# Patient Record
Sex: Female | Born: 1975 | Race: White | Hispanic: No | Marital: Married | Smoking: Never smoker
Health system: Southern US, Community
[De-identification: ages and names within clinical notes are randomized; demographics above are authoritative.]

## PROBLEM LIST (undated history)

## (undated) DIAGNOSIS — R32 Unspecified urinary incontinence: Secondary | ICD-10-CM

## (undated) DIAGNOSIS — N946 Dysmenorrhea, unspecified: Secondary | ICD-10-CM

## (undated) DIAGNOSIS — N979 Female infertility, unspecified: Secondary | ICD-10-CM

## (undated) DIAGNOSIS — T7840XA Allergy, unspecified, initial encounter: Secondary | ICD-10-CM

## (undated) DIAGNOSIS — M1990 Unspecified osteoarthritis, unspecified site: Secondary | ICD-10-CM

## (undated) DIAGNOSIS — N809 Endometriosis, unspecified: Secondary | ICD-10-CM

## (undated) HISTORY — PX: OTHER SURGICAL HISTORY: SHX169

## (undated) HISTORY — DX: Allergy, unspecified, initial encounter: T78.40XA

## (undated) HISTORY — DX: Female infertility, unspecified: N97.9

## (undated) HISTORY — DX: Unspecified urinary incontinence: R32

## (undated) HISTORY — DX: Dysmenorrhea, unspecified: N94.6

## (undated) HISTORY — DX: Endometriosis, unspecified: N80.9

## (undated) HISTORY — PX: HYSTEROSCOPY: SHX211

## (undated) HISTORY — DX: Unspecified osteoarthritis, unspecified site: M19.90

## (undated) HISTORY — PX: GANGLION CYST EXCISION: SHX1691

---

## 1999-10-04 HISTORY — PX: REFRACTIVE SURGERY: SHX103

## 2003-10-04 HISTORY — PX: GANGLION CYST EXCISION: SHX1691

## 2004-10-03 HISTORY — PX: HYSTEROSCOPY: SHX211

## 2018-07-30 ENCOUNTER — Ambulatory Visit: Payer: Self-pay | Admitting: Family Medicine

## 2018-08-20 ENCOUNTER — Ambulatory Visit: Payer: Self-pay | Admitting: Family Medicine

## 2018-09-21 ENCOUNTER — Ambulatory Visit (INDEPENDENT_AMBULATORY_CARE_PROVIDER_SITE_OTHER): Payer: BLUE CROSS/BLUE SHIELD | Admitting: Family Medicine

## 2018-09-21 ENCOUNTER — Encounter: Payer: Self-pay | Admitting: Family Medicine

## 2018-09-21 VITALS — BP 108/75 | HR 85 | Temp 98.3°F | Ht 65.0 in | Wt 144.7 lb

## 2018-09-21 DIAGNOSIS — M1710 Unilateral primary osteoarthritis, unspecified knee: Secondary | ICD-10-CM

## 2018-09-21 DIAGNOSIS — Z8742 Personal history of other diseases of the female genital tract: Secondary | ICD-10-CM | POA: Diagnosis not present

## 2018-09-21 DIAGNOSIS — M1990 Unspecified osteoarthritis, unspecified site: Secondary | ICD-10-CM

## 2018-09-21 DIAGNOSIS — Z83438 Family history of other disorder of lipoprotein metabolism and other lipidemia: Secondary | ICD-10-CM

## 2018-09-21 DIAGNOSIS — Q513 Bicornate uterus: Secondary | ICD-10-CM | POA: Diagnosis not present

## 2018-09-21 DIAGNOSIS — Z789 Other specified health status: Secondary | ICD-10-CM | POA: Diagnosis not present

## 2018-09-21 DIAGNOSIS — Z0000 Encounter for general adult medical examination without abnormal findings: Secondary | ICD-10-CM

## 2018-09-21 DIAGNOSIS — Z8249 Family history of ischemic heart disease and other diseases of the circulatory system: Secondary | ICD-10-CM

## 2018-09-21 NOTE — Patient Instructions (Signed)
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
## 2018-09-23 DIAGNOSIS — M1990 Unspecified osteoarthritis, unspecified site: Secondary | ICD-10-CM | POA: Insufficient documentation

## 2018-09-23 DIAGNOSIS — Z83438 Family history of other disorder of lipoprotein metabolism and other lipidemia: Secondary | ICD-10-CM | POA: Insufficient documentation

## 2018-09-23 DIAGNOSIS — Z8249 Family history of ischemic heart disease and other diseases of the circulatory system: Secondary | ICD-10-CM | POA: Insufficient documentation

## 2018-09-23 DIAGNOSIS — Z8742 Personal history of other diseases of the female genital tract: Secondary | ICD-10-CM | POA: Insufficient documentation

## 2018-09-23 DIAGNOSIS — M1710 Unilateral primary osteoarthritis, unspecified knee: Secondary | ICD-10-CM | POA: Insufficient documentation

## 2018-09-24 ENCOUNTER — Encounter: Payer: Self-pay | Admitting: Obstetrics and Gynecology

## 2018-10-09 ENCOUNTER — Encounter: Payer: Self-pay | Admitting: Obstetrics and Gynecology

## 2018-10-09 ENCOUNTER — Ambulatory Visit (INDEPENDENT_AMBULATORY_CARE_PROVIDER_SITE_OTHER): Payer: BLUE CROSS/BLUE SHIELD | Admitting: Obstetrics and Gynecology

## 2018-10-09 ENCOUNTER — Other Ambulatory Visit: Payer: Self-pay

## 2018-10-09 ENCOUNTER — Other Ambulatory Visit (HOSPITAL_COMMUNITY)
Admission: RE | Admit: 2018-10-09 | Discharge: 2018-10-09 | Disposition: A | Discharge status: Home / Self Care | Payer: BLUE CROSS/BLUE SHIELD | Source: Ambulatory Visit | Attending: Obstetrics and Gynecology | Admitting: Obstetrics and Gynecology

## 2018-10-09 VITALS — BP 112/70 | HR 80 | Ht 65.0 in | Wt 147.0 lb

## 2018-10-09 DIAGNOSIS — Z124 Encounter for screening for malignant neoplasm of cervix: Secondary | ICD-10-CM

## 2018-10-09 DIAGNOSIS — Z01419 Encounter for gynecological examination (general) (routine) without abnormal findings: Secondary | ICD-10-CM

## 2018-10-09 DIAGNOSIS — Z23 Encounter for immunization: Secondary | ICD-10-CM | POA: Diagnosis not present

## 2018-10-09 DIAGNOSIS — N393 Stress incontinence (female) (male): Secondary | ICD-10-CM | POA: Diagnosis not present

## 2018-10-09 MED ORDER — LEVONORGEST-ETH ESTRAD 91-DAY 0.1-0.02 & 0.01 MG PO TABS: 1.0000 | ORAL_TABLET | Freq: Every day | ORAL | 4 refills | Status: DC

## 2018-10-09 NOTE — Patient Instructions (Addendum)
EXERCISE AND DIET:  We recommended that you start or continue a regular exercise program for good health. Regular exercise means any activity that makes your heart beat faster and makes you sweat.  We recommend exercising at least 30 minutes per day at least 3 days a week, preferably 4 or 5.  We also recommend a diet low in fat and sugar.  Inactivity, poor dietary choices and obesity can cause diabetes, heart attack, stroke, and kidney damage, among others.    ALCOHOL AND SMOKING:  Women should limit their alcohol intake to no more than 7 drinks/beers/glasses of wine (combined, not each!) per week. Moderation of alcohol intake to this level decreases your risk of breast cancer and liver damage. And of course, no recreational drugs are part of a healthy lifestyle.  And absolutely no smoking or even second hand smoke. Most people know smoking can cause heart and lung diseases, but did you know it also contributes to weakening of your bones? Aging of your skin?  Yellowing of your teeth and nails?  CALCIUM AND VITAMIN D:  Adequate intake of calcium and Vitamin D are recommended.  The recommendations for exact amounts of these supplements seem to change often, but generally speaking 1,000 mg of calcium (between diet and supplement) and 800 units of Vitamin D per day seems prudent. Certain women may benefit from higher intake of Vitamin D.  If you are among these women, your doctor will have told you during your visit.    PAP SMEARS:  Pap smears, to check for cervical cancer or precancers,  have traditionally been done yearly, although recent scientific advances have shown that most women can have pap smears less often.  However, every woman still should have a physical exam from her gynecologist every year. It will include a breast check, inspection of the vulva and vagina to check for abnormal growths or skin changes, a visual exam of the cervix, and then an exam to evaluate the size and shape of the uterus and  ovaries.  And after 40 years of age, a rectal exam is indicated to check for rectal cancers. We will also provide age appropriate advice regarding health maintenance, like when you should have certain vaccines, screening for sexually transmitted diseases, bone density testing, colonoscopy, mammograms, etc.   MAMMOGRAMS:  All women over 40 years old should have a yearly mammogram. Many facilities now offer a "3D" mammogram, which may cost around $50 extra out of pocket. If possible,  we recommend you accept the option to have the 3D mammogram performed.  It both reduces the number of women who will be called back for extra views which then turn out to be normal, and it is better than the routine mammogram at detecting truly abnormal areas.    COLON CANCER SCREENING: Now recommend starting at age 45. At this time colonoscopy is not covered for routine screening until 50. There are take home tests that can be done between 45-49.   COLONOSCOPY:  Colonoscopy to screen for colon cancer is recommended for all women at age 50.  We know, you hate the idea of the prep.  We agree, BUT, having colon cancer and not knowing it is worse!!  Colon cancer so often starts as a polyp that can be seen and removed at colonscopy, which can quite literally save your life!  And if your first colonoscopy is normal and you have no family history of colon cancer, most women don't have to have it again for   10 years.  Once every ten years, you can do something that may end up saving your life, right?  We will be happy to help you get it scheduled when you are ready.  Be sure to check your insurance coverage so you understand how much it will cost.  It may be covered as a preventative service at no cost, but you should check your particular policy.      Breast Self-Awareness Breast self-awareness means being familiar with how your breasts look and feel. It involves checking your breasts regularly and reporting any changes to your  health care provider. Practicing breast self-awareness is important. A change in your breasts can be a sign of a serious medical problem. Being familiar with how your breasts look and feel allows you to find any problems early, when treatment is more likely to be successful. All women should practice breast self-awareness, including women who have had breast implants. How to do a breast self-exam One way to learn what is normal for your breasts and whether your breasts are changing is to do a breast self-exam. To do a breast self-exam: Look for Changes  1. Remove all the clothing above your waist. 2. Stand in front of a mirror in a room with good lighting. 3. Put your hands on your hips. 4. Push your hands firmly downward. 5. Compare your breasts in the mirror. Look for differences between them (asymmetry), such as: ? Differences in shape. ? Differences in size. ? Puckers, dips, and bumps in one breast and not the other. 6. Look at each breast for changes in your skin, such as: ? Redness. ? Scaly areas. 7. Look for changes in your nipples, such as: ? Discharge. ? Bleeding. ? Dimpling. ? Redness. ? A change in position. Feel for Changes Carefully feel your breasts for lumps and changes. It is best to do this while lying on your back on the floor and again while sitting or standing in the shower or tub with soapy water on your skin. Feel each breast in the following way:  Place the arm on the side of the breast you are examining above your head.  Feel your breast with the other hand.  Start in the nipple area and make  inch (2 cm) overlapping circles to feel your breast. Use the pads of your three middle fingers to do this. Apply light pressure, then medium pressure, then firm pressure. The light pressure will allow you to feel the tissue closest to the skin. The medium pressure will allow you to feel the tissue that is a little deeper. The firm pressure will allow you to feel the tissue  close to the ribs.  Continue the overlapping circles, moving downward over the breast until you feel your ribs below your breast.  Move one finger-width toward the center of the body. Continue to use the  inch (2 cm) overlapping circles to feel your breast as you move slowly up toward your collarbone.  Continue the up and down exam using all three pressures until you reach your armpit.  Write Down What You Find  Write down what is normal for each breast and any changes that you find. Keep a written record with breast changes or normal findings for each breast. By writing this information down, you do not need to depend only on memory for size, tenderness, or location. Write down where you are in your menstrual cycle, if you are still menstruating. If you are having trouble noticing differences   in your breasts, do not get discouraged. With time you will become more familiar with the variations in your breasts and more comfortable with the exam. How often should I examine my breasts? Examine your breasts every month. If you are breastfeeding, the best time to examine your breasts is after a feeding or after using a breast pump. If you menstruate, the best time to examine your breasts is 5-7 days after your period is over. During your period, your breasts are lumpier, and it may be more difficult to notice changes. When should I see my health care provider? See your health care provider if you notice:  A change in shape or size of your breasts or nipples.  A change in the skin of your breast or nipples, such as a reddened or scaly area.  Unusual discharge from your nipples.  A lump or thick area that was not there before.  Pain in your breasts.  Anything that concerns you.  Mammogram Facilities  Yearly screening mammograms are recommended for women beginning at age 63. For a routine screening mammogram, you may schedule the appointment and have it done at the location of your choice.   Please ask the facility to send the results to our office. (fax [phone]) Location options include:  *[facility] [address] [place], [address] [place], [place] [address] [phone]  [facility] [address], [address] [place], [place] [address] [phone]   Urinary Incontinence  Urinary incontinence refers to a condition in which a person is unable to control where and when to pass urine. A person with this condition will urinate when he or she does not mean to (involuntarily). What are the causes? This condition may be caused by:  Medicines.  Infections.  Constipation.  Overactive bladder muscles.  Weak bladder muscles.  Weak pelvic floor muscles. These muscles provide support for the bladder, intestine, and, in women, the uterus.  Enlarged prostate in men. The prostate is a gland near the bladder. When it gets too big, it can pinch the urethra. With the urethra blocked, the bladder can weaken and lose the ability to empty properly.  Surgery.  Emotional factors, such as anxiety, stress, or post-traumatic stress disorder (PTSD).  Pelvic organ prolapse. This happens in women when organs shift out of place and into the vagina. This shift can prevent the bladder and urethra from working properly. What increases the risk? The following factors may make you more likely to develop this condition:  Older age.  Obesity and physical inactivity.  Pregnancy and childbirth.  Menopause.  Diseases that affect the nerves or spinal cord (neurological diseases).  Long-term (chronic) coughing. This can increase pressure on the bladder and pelvic floor muscles. What are the signs or symptoms? Symptoms may vary depending on the type of urinary incontinence you have. They include:  A sudden urge to urinate, but passing urine involuntarily before you can get to a bathroom (urge incontinence).  Suddenly passing urine with any activity  that forces urine to pass, such as coughing, laughing, exercise, or sneezing (stress incontinence).  Needing to urinate often, but urinating only a small amount, or constantly dribbling urine (overflow incontinence).  Urinating because you cannot get to the bathroom in time due to a physical disability, such as arthritis or injury, or communication and thinking problems, such as Alzheimer disease (functional incontinence). How is this diagnosed? This condition may be diagnosed based on:  Your medical history.  A physical exam.  Tests, such as: ? Urine tests. ? X-rays of your kidney and bladder. ? Ultrasound. ? CT scan. ? Cystoscopy. In this procedure, a health care provider inserts a tube with a light and camera (cystoscope) through the urethra and into the bladder in order to check for problems. ? Urodynamic testing. These tests assess how well the bladder, urethra, and sphincter can store and release urine. There are different types of urodynamic tests, and they vary depending on what the test is measuring. To help diagnose your condition, your health care provider may recommend that you keep a log of when you urinate and how much you urinate. How is this treated? Treatment for this condition depends on the type of incontinence that you have and its cause. Treatment may include:  Lifestyle changes, such as: ? Quitting smoking. ? Maintaining a healthy weight. ? Staying active. Try to get 150 minutes of moderate-intensity exercise every week. Ask your health care provider which activities are safe for you. ? Eating a healthy diet.  Avoid high-fat foods, like fried foods.  Avoid refined carbohydrates like white bread and white rice.  Limit how much alcohol and caffeine you drink.  Increase your fiber intake. Foods such as fresh fruits, vegetables, beans, and whole grains are healthy sources of fiber.  Pelvic floor muscle exercises.  Bladder training, such as lengthening the  amount of time between bathroom breaks, or using the bathroom at regular intervals.  Using techniques to suppress bladder urges. This can include distraction techniques or controlled breathing exercises.  Medicines to relax the bladder muscles and prevent bladder spasms.  Medicines to help slow or prevent the growth of a man's prostate.  Botox injections. These can help relax the bladder muscles.  Using pulses of electricity to help change bladder reflexes (electrical nerve stimulation).  For women, using a medical device to prevent urine leaks. This is a small, tampon-like, disposable device that is inserted into the urethra.  Injecting collagen or carbon beads (bulking agents) into the urinary sphincter. These can help thicken tissue and close the bladder opening.  Surgery. Follow these instructions at home: Lifestyle  Limit alcohol and caffeine. These can fill your bladder quickly and irritate it.  Keep yourself clean to help prevent odors and skin damage. Ask your doctor about special skin creams and cleansers that can protect the skin from urine.  Consider wearing pads or adult diapers. Make sure to change them regularly, and always change them right after experiencing incontinence. General instructions  Take over-the-counter and prescription medicines only as told by your health care provider.  Use the bathroom about every 3-4 hours, even if you do not feel the need to urinate. Try to empty your bladder completely every time. After urinating, wait a minute. Then try to urinate again.  Make sure you are in a relaxed position while urinating.  If your incontinence is caused by nerve problems, keep a log of the medicines you take and the times you go to the bathroom.  Keep all follow-up visits as told by your health care provider. This is important. Contact a health care provider if:  You have pain that gets worse.  Your incontinence gets worse. Get help right away  if:  You have a fever or chills.  You are unable to urinate.  You have redness in your groin area or down your legs. Summary  Urinary incontinence refers to a condition in which a person is unable to control where and when to pass urine.  This condition may be caused by medicines, infection, weak bladder muscles, weak pelvic floor muscles, enlargement of the prostate (in men), or surgery.  The following factors increase your risk for developing this condition: older age, obesity, pregnancy and childbirth, menopause, neurological diseases, and chronic coughing.  There are several types of urinary incontinence. They include urge incontinence, stress incontinence, overflow incontinence, and functional incontinence.  This condition is usually treated first with lifestyle and behavioral changes, such as quitting smoking, eating a healthier diet, and doing regular pelvic floor exercises. Other treatment options include medicines, bulking agents, medical devices, electrical nerve stimulation, or surgery. This information is not intended to replace advice given to you by your health care provider. Make sure you discuss any questions you have with your health care provider. Document Released: 10/27/2004 Document Revised: 12/29/2016 Document Reviewed: 12/29/2016 Elsevier Interactive Patient Education  2019 [employer]  Kegel Exercises Kegel exercises help strengthen the muscles that support the rectum, vagina, small intestine, bladder, and uterus. Doing Kegel exercises can help:  Improve bladder and bowel control.  Improve sexual response.  Reduce problems and discomfort during pregnancy. Kegel exercises involve squeezing your pelvic floor muscles, which are the same muscles you squeeze when you try to stop the flow of urine. The exercises can be done while sitting, standing, or lying down, but it is best to vary your position. Exercises 1. Squeeze your pelvic floor muscles tight. You should  feel a tight lift in your rectal area. If you are a female, you should also feel a tightness in your vaginal area. Keep your stomach, buttocks, and legs relaxed. 2. Hold the muscles tight for up to 10 seconds. 3. Relax your muscles. Repeat this exercise 50 times a day or as many times as told by your health care provider. Continue to do this exercise for at least 4-6 weeks or for as long as told by your health care provider. This information is not intended to replace advice given to you by your health care provider. Make sure you discuss any questions you have with your health care provider. Document Released: 09/05/2012 Document Revised: 01/30/2017 Document Reviewed: 08/09/2015 Elsevier Interactive Patient Education  2019 [employer]

## 2018-10-09 NOTE — Progress Notes (Signed)
43 y.o. G42P2002 Married White or Caucasian Not Hispanic or Latino female here for annual exam and wants to talk about birth control alternatives. She is interested in a different pill.  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 She went on to have 2 FTNSVD's.    Period Cycle (Days): 84 Period Duration (Days): 4 days Period Pattern: Regular Menstrual Flow: Light Menstrual Control: Other (Comment) Menstrual Control Change Freq (Hours): empties cup twice a day Dysmenorrhea: None  Patient's last menstrual period was 07/20/2018 (exact date).          Sexually active: Yes.    The current method of family planning is OCP (estrogen/progesterone).    Exercising: Yes.    walking Smoker:  no  Health Maintenance: Pap:  Fall 2018 WNL per patient History of abnormal Pap:  Yes, many years ago, repeat was normal MMG:  November of 2018,WNL per patient, [place] out of state BMD:   Never Colonoscopy: Never TDaP:  Unsure Gardasil: Never   reports that she has never smoked. She has never used smokeless tobacco. She reports current alcohol use of about 1.0 - 2.0 standard drinks of alcohol per week. She reports that she does not use drugs. 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 Kids 49.68 year old son and almost 58 year old daughter.   Past Medical History:  Diagnosis Date  . Dysmenorrhea   . Endometriosis   . Infertility, female   . Urinary incontinence   H/O severe endometriosis. Treated at time of laparoscopy.  Past Surgical History:  Procedure Laterality Date  . GANGLION CYST EXCISION Bilateral   . HYSTEROSCOPY    . OTHER SURGICAL HISTORY     2 uterus surgries in 2006  . REFRACTIVE SURGERY  2001    Current Outpatient Medications  Medication Sig Dispense Refill  . CANNABIDIOL PO Take by mouth.    . Fexofenadine HCl (ALLEGRA PO) Take 1 tablet by mouth daily.    . Multiple Vitamin (MULTIVITAMIN) tablet Take 1 tablet by mouth daily.     . naproxen sodium (ALEVE) 220 MG tablet Take 220 mg by mouth.    . norgestrel-ethinyl estradiol (OGESTROL) 0.5-50 MG-MCG tablet Take 1 tablet by mouth daily.    . Omega-3 Fatty Acids (FISH OIL PO) Take 1 capsule by mouth daily.     No current facility-administered medications for this visit.     Family History  Problem Relation Age of Onset  . Diabetes Father 45  . High Cholesterol Father   . Hypertension Father   . Alcohol abuse Maternal Grandfather   . Diabetes Brother 20  . Hyperlipidemia Brother   . Obesity Brother   . Diabetes Brother 20  . Hyperlipidemia Brother   . Obesity Brother     Review of Systems  Constitutional: Negative.   HENT: Negative.   Eyes: Negative.   Respiratory: Negative.   Cardiovascular: Negative.   Gastrointestinal: Negative.   Endocrine: Negative.   Genitourinary:       Urinary leakage  Musculoskeletal: Negative.   Skin: Negative.   Allergic/Immunologic: Negative.   Neurological: Negative.   Hematological: Negative.   Psychiatric/Behavioral: Negative.   She has urinary leakage with running or being on the trampoline, some with laughing or coughing. Leaks a small amount a couple of times a week.  Exam:   BP 112/70 (BP Location: Right Arm, Patient Position: Sitting, Cuff Size: Normal)   Pulse 80   Ht 5\' 5"  (  1.651 m)   Wt 147 lb (66.7 kg)   LMP 07/20/2018 (Exact Date)   BMI 24.46 kg/m   Weight change: @WEIGHTCHANGE @ Height:   Height: 5\' 5"  (165.1 cm)  Ht Readings from Last 3 Encounters:  10/09/18 5\' 5"  (1.651 m)  09/21/18 5\' 5"  (1.651 m)    General appearance: alert, cooperative and appears stated age Head: Normocephalic, without obvious abnormality, atraumatic Neck: no adenopathy, supple, symmetrical, trachea midline and thyroid normal to inspection and palpation Lungs: clear to auscultation bilaterally Cardiovascular: regular rate and rhythm Breasts: normal appearance, no masses or tenderness Abdomen: soft, non-tender; non  distended,  no masses,  no organomegaly Extremities: extremities normal, atraumatic, no cyanosis or edema Skin: Skin color, texture, turgor normal. No rashes or lesions Lymph nodes: Cervical, supraclavicular, and axillary nodes normal. No abnormal inguinal nodes palpated Neurologic: Grossly normal   Pelvic: External genitalia:  no lesions              Urethra:  normal appearing urethra with no masses, tenderness or lesions              Bartholins and Skenes: normal                 Vagina: normal appearing vagina with normal color and discharge, no lesions              Cervix: no lesions               Bimanual Exam:  Uterus:  normal size, contour, position, consistency, mobility, non-tender              Adnexa: no mass, fullness, tenderness               Rectovaginal: Confirms               Anus:  normal sphincter tone, no lesions  Chaperone was present for exam.  A:  Well Woman with normal exam  Currently on high dose OCP (she didn't realize it was high dose), will change to 20 mcg pill   GSI  P:   Pap with hpv  Change to loseasonique  Information on kegels given, discussed option of PT and surgery for GSI  Mammogram due  TDAP today  Labs with primary   Discussed breast self exam  Discussed calcium and vit D intake

## 2018-10-09 NOTE — Addendum Note (Signed)
Addended by: Tobi BastosJERTSON, Yvonne Stopher E on: 10/09/2018 10:08 AM   Modules accepted: Orders

## 2018-10-10 LAB — CYTOLOGY - PAP
Diagnosis: NEGATIVE
HPV: NOT DETECTED

## 2018-10-18 ENCOUNTER — Other Ambulatory Visit: Payer: Self-pay | Admitting: Family Medicine

## 2018-10-18 DIAGNOSIS — Z1231 Encounter for screening mammogram for malignant neoplasm of breast: Secondary | ICD-10-CM

## 2018-11-07 ENCOUNTER — Ambulatory Visit (INDEPENDENT_AMBULATORY_CARE_PROVIDER_SITE_OTHER): Payer: BLUE CROSS/BLUE SHIELD | Admitting: Family Medicine

## 2018-11-07 ENCOUNTER — Encounter: Payer: Self-pay | Admitting: Family Medicine

## 2018-11-07 VITALS — BP 115/77 | HR 78 | Temp 98.3°F | Ht 65.0 in | Wt 149.9 lb

## 2018-11-07 DIAGNOSIS — Z0000 Encounter for general adult medical examination without abnormal findings: Secondary | ICD-10-CM

## 2018-11-07 DIAGNOSIS — Z719 Counseling, unspecified: Secondary | ICD-10-CM

## 2018-11-07 NOTE — Patient Instructions (Addendum)
Preventive Care for Adults, Female  A healthy lifestyle and preventive care can promote health and wellness. Preventive health guidelines for women include the following key practices.   A routine yearly physical is a good way to check with your health care provider about your health and preventive screening. It is a chance to share any concerns and updates on your health and to receive a thorough exam.   Visit your dentist for a routine exam and preventive care every 6 months. Brush your teeth twice a day and floss once a day. Good oral hygiene prevents tooth decay and gum disease.   The frequency of eye exams is based on your age, health, family medical history, use of contact lenses, and other factors. Follow your health care provider's recommendations for frequency of eye exams.   Eat a healthy diet. Foods like vegetables, fruits, whole grains, low-fat dairy products, and lean protein foods contain the nutrients you need without too many calories. Decrease your intake of foods high in solid fats, added sugars, and salt. Eat the right amount of calories for you.Get information about a proper diet from your health care provider, if necessary.   Regular physical exercise is one of the most important things you can do for your health. Most adults should get at least 150 minutes of moderate-intensity exercise (any activity that increases your heart rate and causes you to sweat) each week. In addition, most adults need muscle-strengthening exercises on 2 or more days a week.   Maintain a healthy weight. The body mass index (BMI) is a screening tool to identify possible weight problems. It provides an estimate of body fat based on height and weight. Your health care provider can find your BMI, and can help you achieve or maintain a healthy weight.For adults 20 years and older:   - A BMI below 18.5 is considered underweight.   - A BMI of 18.5 to 24.9 is normal.   - A BMI of 25 to 29.9 is  considered overweight.   - A BMI of 30 and above is considered obese.   Maintain normal blood lipids and cholesterol levels by exercising and minimizing your intake of trans and saturated fats.  Eat a balanced diet with plenty of fruit and vegetables. Blood tests for lipids and cholesterol should begin at age 20 and be repeated every 5 years minimum.  If your lipid or cholesterol levels are high, you are over 40, or you are at high risk for heart disease, you may need your cholesterol levels checked more frequently.Ongoing high lipid and cholesterol levels should be treated with medicines if diet and exercise are not working.   If you smoke, find out from your health care provider how to quit. If you do not use tobacco, do not start.   Lung cancer screening is recommended for adults aged 55-80 years who are at high risk for developing lung cancer because of a history of smoking. A yearly low-dose CT scan of the lungs is recommended for people who have at least a 30-pack-year history of smoking and are a current smoker or have quit within the past 15 years. A pack year of smoking is smoking an average of 1 pack of cigarettes a day for 1 year (for example: 1 pack a day for 30 years or 2 packs a day for 15 years). Yearly screening should continue until the smoker has stopped smoking for at least 15 years. Yearly screening should be stopped for people who develop a   health problem that would prevent them from having lung cancer treatment.   If you are pregnant, do not drink alcohol. If you are breastfeeding, be very cautious about drinking alcohol. If you are not pregnant and choose to drink alcohol, do not have more than 1 drink per day. One drink is considered to be 12 ounces (355 mL) of beer, 5 ounces (148 mL) of wine, or 1.5 ounces (44 mL) of liquor.   Avoid use of street drugs. Do not share needles with anyone. Ask for help if you need support or instructions about stopping the use of  drugs.   High blood pressure causes heart disease and increases the risk of stroke. Your blood pressure should be checked at least yearly.  Ongoing high blood pressure should be treated with medicines if weight loss and exercise do not work.   If you are 69-55 years old, ask your health care provider if you should take aspirin to prevent strokes.   Diabetes screening involves taking a blood sample to check your fasting blood sugar level. This should be done once every 3 years, after age 38, if you are within normal weight and without risk factors for diabetes. Testing should be considered at a younger age or be carried out more frequently if you are overweight and have at least 1 risk factor for diabetes.   Breast cancer screening is essential preventive care for women. You should practice "breast self-awareness."  This means understanding the normal appearance and feel of your breasts and may include breast self-examination.  Any changes detected, no matter how small, should be reported to a health care provider.  Women in their 80s and 30s should have a clinical breast exam (CBE) by a health care provider as part of a regular health exam every 1 to 3 years.  After age 66, women should have a CBE every year.  Starting at age 1, women should consider having a mammogram (breast X-ray test) every year.  Women who have a family history of breast cancer should talk to their health care provider about genetic screening.  Women at a high risk of breast cancer should talk to their health care providers about having an MRI and a mammogram every year.   -Breast cancer gene (BRCA)-related cancer risk assessment is recommended for women who have family members with BRCA-related cancers. BRCA-related cancers include breast, ovarian, tubal, and peritoneal cancers. Having family members with these cancers may be associated with an increased risk for harmful changes (mutations) in the breast cancer genes BRCA1 and  BRCA2. Results of the assessment will determine the need for genetic counseling and BRCA1 and BRCA2 testing.   The Pap test is a screening test for cervical cancer. A Pap test can show cell changes on the cervix that might become cervical cancer if left untreated. A Pap test is a procedure in which cells are obtained and examined from the lower end of the uterus (cervix).   - Women should have a Pap test starting at age 57.   - Between ages 90 and 70, Pap tests should be repeated every 2 years.   - Beginning at age 63, you should have a Pap test every 3 years as long as the past 3 Pap tests have been normal.   - Some women have medical problems that increase the chance of getting cervical cancer. Talk to your health care provider about these problems. It is especially important to talk to your health care provider if a  new problem develops soon after your last Pap test. In these cases, your health care provider may recommend more frequent screening and Pap tests.   - The above recommendations are the same for women who have or have not gotten the vaccine for human papillomavirus (HPV).   - If you had a hysterectomy for a problem that was not cancer or a condition that could lead to cancer, then you no longer need Pap tests. Even if you no longer need a Pap test, a regular exam is a good idea to make sure no other problems are starting.   - If you are between ages 36 and 66 years, and you have had normal Pap tests going back 10 years, you no longer need Pap tests. Even if you no longer need a Pap test, a regular exam is a good idea to make sure no other problems are starting.   - If you have had past treatment for cervical cancer or a condition that could lead to cancer, you need Pap tests and screening for cancer for at least 20 years after your treatment.   - If Pap tests have been discontinued, risk factors (such as a new sexual partner) need to be reassessed to determine if screening should  be resumed.   - The HPV test is an additional test that may be used for cervical cancer screening. The HPV test looks for the virus that can cause the cell changes on the cervix. The cells collected during the Pap test can be tested for HPV. The HPV test could be used to screen women aged 70 years and older, and should be used in women of any age who have unclear Pap test results. After the age of 67, women should have HPV testing at the same frequency as a Pap test.   Colorectal cancer can be detected and often prevented. Most routine colorectal cancer screening begins at the age of 57 years and continues through age 26 years. However, your health care provider may recommend screening at an earlier age if you have risk factors for colon cancer. On a yearly basis, your health care provider may provide home test kits to check for hidden blood in the stool.  Use of a small camera at the end of a tube, to directly examine the colon (sigmoidoscopy or colonoscopy), can detect the earliest forms of colorectal cancer. Talk to your health care provider about this at age 23, when routine screening begins. Direct exam of the colon should be repeated every 5 -10 years through age 49 years, unless early forms of pre-cancerous polyps or small growths are found.   People who are at an increased risk for hepatitis B should be screened for this virus. You are considered at high risk for hepatitis B if:  -You were born in a country where hepatitis B occurs often. Talk with your health care provider about which countries are considered high risk.  - Your parents were born in a high-risk country and you have not received a shot to protect against hepatitis B (hepatitis B vaccine).  - You have HIV or AIDS.  - You use needles to inject street drugs.  - You live with, or have sex with, someone who has Hepatitis B.  - You get hemodialysis treatment.  - You take certain medicines for conditions like cancer, organ  transplantation, and autoimmune conditions.   Hepatitis C blood testing is recommended for all people born from [birth year] through 1965 and any individual  with known risks for hepatitis C.   Practice safe sex. Use condoms and avoid high-risk sexual practices to reduce the spread of sexually transmitted infections (STIs). STIs include gonorrhea, chlamydia, syphilis, trichomonas, herpes, HPV, and human immunodeficiency virus (HIV). Herpes, HIV, and HPV are viral illnesses that have no cure. They can result in disability, cancer, and death. Sexually active women aged 25 years and younger should be checked for chlamydia. Older women with new or multiple partners should also be tested for chlamydia. Testing for other STIs is recommended if you are sexually active and at increased risk.   Osteoporosis is a disease in which the bones lose minerals and strength with aging. This can result in serious bone fractures or breaks. The risk of osteoporosis can be identified using a bone density scan. Women ages 65 years and over and women at risk for fractures or osteoporosis should discuss screening with their health care providers. Ask your health care provider whether you should take a calcium supplement or vitamin D to There are also several preventive steps women can take to avoid osteoporosis and resulting fractures or to keep osteoporosis from worsening. -->Recommendations include:  Eat a balanced diet high in fruits, vegetables, calcium, and vitamins.  Get enough calcium. The recommended total intake of is 1,200 mg daily; for best absorption, if taking supplements, divide doses into 250-500 mg doses throughout the day. Of the two types of calcium, calcium carbonate is best absorbed when taken with food but calcium citrate can be taken on an empty stomach.  Get enough vitamin D. NAMS and the National Osteoporosis Foundation recommend at least 1,000 IU per day for women age 50 and over who are at risk of vitamin D  deficiency. Vitamin D deficiency can be caused by inadequate sun exposure (for example, those who live in northern latitudes).  Avoid alcohol and smoking. Heavy alcohol intake (more than 7 drinks per week) increases the risk of falls and hip fracture and women smokers tend to lose bone more rapidly and have lower bone mass than nonsmokers. Stopping smoking is one of the most important changes women can make to improve their health and decrease risk for disease.  Be physically active every day. Weight-bearing exercise (for example, fast walking, hiking, jogging, and weight training) may strengthen bones or slow the rate of bone loss that comes with aging. Balancing and muscle-strengthening exercises can reduce the risk of falling and fracture.  Consider therapeutic medications. Currently, several types of effective drugs are available. Healthcare providers can recommend the type most appropriate for each woman.  Eliminate environmental factors that may contribute to accidents. Falls cause nearly 90% of all osteoporotic fractures, so reducing this risk is an important bone-health strategy. Measures include ample lighting, removing obstructions to walking, using nonskid rugs on floors, and placing mats and/or grab bars in showers.  Be aware of medication side effects. Some common medicines make bones weaker. These include a type of steroid drug called glucocorticoids used for arthritis and asthma, some antiseizure drugs, certain sleeping pills, treatments for endometriosis, and some cancer drugs. An overactive thyroid gland or using too much thyroid hormone for an underactive thyroid can also be a problem. If you are taking these medicines, talk to your doctor about what you can do to help protect your bones.reduce the rate of osteoporosis.    Menopause can be associated with physical symptoms and risks. Hormone replacement therapy is available to decrease symptoms and risks. You should talk to your  health care provider   about whether hormone replacement therapy is right for you.   Use sunscreen. Apply sunscreen liberally and repeatedly throughout the day. You should seek shade when your shadow is shorter than you. Protect yourself by wearing long sleeves, pants, a wide-brimmed hat, and sunglasses year round, whenever you are outdoors.   Once a month, do a whole body skin exam, using a mirror to look at the skin on your back. Tell your health care provider of new moles, moles that have irregular borders, moles that are larger than a pencil eraser, or moles that have changed in shape or color.   -Stay current with required vaccines (immunizations).   Influenza vaccine. All adults should be immunized every year.  Tetanus, diphtheria, and acellular pertussis (Td, Tdap) vaccine. Pregnant women should receive 1 dose of Tdap vaccine during each pregnancy. The dose should be obtained regardless of the length of time since the last dose. Immunization is preferred during the 27th 36th week of gestation. An adult who has not previously received Tdap or who does not know her vaccine status should receive 1 dose of Tdap. This initial dose should be followed by tetanus and diphtheria toxoids (Td) booster doses every 10 years. Adults with an unknown or incomplete history of completing a 3-dose immunization series with Td-containing vaccines should begin or complete a primary immunization series including a Tdap dose. Adults should receive a Td booster every 10 years.  Varicella vaccine. An adult without evidence of immunity to varicella should receive 2 doses or a second dose if she has previously received 1 dose. Pregnant females who do not have evidence of immunity should receive the first dose after pregnancy. This first dose should be obtained before leaving the health care facility. The second dose should be obtained 4 8 weeks after the first dose.  Human papillomavirus (HPV) vaccine. Females aged 13 26  years who have not received the vaccine previously should obtain the 3-dose series. The vaccine is not recommended for use in pregnant females. However, pregnancy testing is not needed before receiving a dose. If a female is found to be pregnant after receiving a dose, no treatment is needed. In that case, the remaining doses should be delayed until after the pregnancy. Immunization is recommended for any person with an immunocompromised condition through the age of 26 years if she did not get any or all doses earlier. During the 3-dose series, the second dose should be obtained 4 8 weeks after the first dose. The third dose should be obtained 24 weeks after the first dose and 16 weeks after the second dose.  Zoster vaccine. One dose is recommended for adults aged 60 years or older unless certain conditions are present.  Measles, mumps, and rubella (MMR) vaccine. Adults born before 1957 generally are considered immune to measles and mumps. Adults born in 1957 or later should have 1 or more doses of MMR vaccine unless there is a contraindication to the vaccine or there is laboratory evidence of immunity to each of the three diseases. A routine second dose of MMR vaccine should be obtained at least 28 days after the first dose for students attending postsecondary schools, health care workers, or international travelers. People who received inactivated measles vaccine or an unknown type of measles vaccine during 1963 1967 should receive 2 doses of MMR vaccine. People who received inactivated mumps vaccine or an unknown type of mumps vaccine before 1979 and are at high risk for mumps infection should consider immunization with 2 doses of   MMR vaccine. For females of childbearing age, rubella immunity should be determined. If there is no evidence of immunity, females who are not pregnant should be vaccinated. If there is no evidence of immunity, females who are pregnant should delay immunization until after pregnancy.  Unvaccinated health care workers born before [birth year] who lack laboratory evidence of measles, mumps, or rubella immunity or laboratory confirmation of disease should consider measles and mumps immunization with 2 doses of MMR vaccine or rubella immunization with 1 dose of MMR vaccine.  Pneumococcal 13-valent conjugate (PCV13) vaccine. When indicated, a person who is uncertain of her immunization history and has no record of immunization should receive the PCV13 vaccine. An adult aged 54 years or older who has certain medical conditions and has not been previously immunized should receive 1 dose of PCV13 vaccine. This PCV13 should be followed with a dose of pneumococcal polysaccharide (PPSV23) vaccine. The PPSV23 vaccine dose should be obtained at least 8 weeks after the dose of PCV13 vaccine. An adult aged 58 years or older who has certain medical conditions and previously received 1 or more doses of PPSV23 vaccine should receive 1 dose of PCV13. The PCV13 vaccine dose should be obtained 1 or more years after the last PPSV23 vaccine dose.  Pneumococcal polysaccharide (PPSV23) vaccine. When PCV13 is also indicated, PCV13 should be obtained first. All adults aged 58 years and older should be immunized. An adult younger than age 65 years who has certain medical conditions should be immunized. Any person who resides in a nursing home or long-term care facility should be immunized. An adult smoker should be immunized. People with an immunocompromised condition and certain other conditions should receive both PCV13 and PPSV23 vaccines. People with human immunodeficiency virus (HIV) infection should be immunized as soon as possible after diagnosis. Immunization during chemotherapy or radiation therapy should be avoided. Routine use of PPSV23 vaccine is not recommended for American Indians, [place] Natives, or people younger than 65 years unless there are medical conditions that require PPSV23 vaccine. When indicated,  people who have unknown immunization and have no record of immunization should receive PPSV23 vaccine. One-time revaccination 5 years after the first dose of PPSV23 is recommended for people aged 70 64 years who have chronic kidney failure, nephrotic syndrome, asplenia, or immunocompromised conditions. People who received 1 2 doses of PPSV23 before age 32 years should receive another dose of PPSV23 vaccine at age 96 years or later if at least 5 years have passed since the previous dose. Doses of PPSV23 are not needed for people immunized with PPSV23 at or after age 55 years.  Meningococcal vaccine. Adults with asplenia or persistent complement component deficiencies should receive 2 doses of quadrivalent meningococcal conjugate (MenACWY-D) vaccine. The doses should be obtained at least 2 months apart. Microbiologists working with certain meningococcal bacteria, [employer] recruits, people at risk during an outbreak, and people who travel to or live in countries with a high rate of meningitis should be immunized. A first-year college student up through age 58 years who is living in a residence hall should receive a dose if she did not receive a dose on or after her 16th birthday. Adults who have certain high-risk conditions should receive one or more doses of vaccine.  Hepatitis A vaccine. Adults who wish to be protected from this disease, have certain high-risk conditions, work with hepatitis A-infected animals, work in hepatitis A research labs, or travel to or work in countries with a high rate of hepatitis A should be  immunized. Adults who were previously unvaccinated and who anticipate close contact with an international adoptee during the first 60 days after arrival in the [place] from a country with a high rate of hepatitis A should be immunized.  Hepatitis B vaccine.  Adults who wish to be protected from this disease, have certain high-risk conditions, may be exposed to blood or other infectious  body fluids, are household contacts or sex partners of hepatitis B positive people, are clients or workers in certain care facilities, or travel to or work in countries with a high rate of hepatitis B should be immunized.  Haemophilus influenzae type b (Hib) vaccine. A previously unvaccinated person with asplenia or sickle cell disease or having a scheduled splenectomy should receive 1 dose of Hib vaccine. Regardless of previous immunization, a recipient of a hematopoietic stem cell transplant should receive a 3-dose series 6 12 months after her successful transplant. Hib vaccine is not recommended for adults with HIV infection.  Preventive Services / Frequency Ages 6 to 39years  Blood pressure check.** / Every 1 to 2 years.  Lipid and cholesterol check.** / Every 5 years beginning at age 39.  Clinical breast exam.** / Every 3 years for women in their 61s and 62s.  BRCA-related cancer risk assessment.** / For women who have family members with a BRCA-related cancer (breast, ovarian, tubal, or peritoneal cancers).  Pap test.** / Every 2 years from ages 47 through 85. Every 3 years starting at age 34 through age 12 or 74 with a history of 3 consecutive normal Pap tests.  HPV screening.** / Every 3 years from ages 46 through ages 43 to 54 with a history of 3 consecutive normal Pap tests.  Hepatitis C blood test.** / For any individual with known risks for hepatitis C.  Skin self-exam. / Monthly.  Influenza vaccine. / Every year.  Tetanus, diphtheria, and acellular pertussis (Tdap, Td) vaccine.** / Consult your health care provider. Pregnant women should receive 1 dose of Tdap vaccine during each pregnancy. 1 dose of Td every 10 years.  Varicella vaccine.** / Consult your health care provider. Pregnant females who do not have evidence of immunity should receive the first dose after pregnancy.  HPV vaccine. / 3 doses over 6 months, if 64 and younger. The vaccine is not recommended for use in  pregnant females. However, pregnancy testing is not needed before receiving a dose.  Measles, mumps, rubella (MMR) vaccine.** / You need at least 1 dose of MMR if you were born in 1957 or later. You may also need a 2nd dose. For females of childbearing age, rubella immunity should be determined. If there is no evidence of immunity, females who are not pregnant should be vaccinated. If there is no evidence of immunity, females who are pregnant should delay immunization until after pregnancy.  Pneumococcal 13-valent conjugate (PCV13) vaccine.** / Consult your health care provider.  Pneumococcal polysaccharide (PPSV23) vaccine.** / 1 to 2 doses if you smoke cigarettes or if you have certain conditions.  Meningococcal vaccine.** / 1 dose if you are age 71 to 37 years and a Market researcher living in a residence hall, or have one of several medical conditions, you need to get vaccinated against meningococcal disease. You may also need additional booster doses.  Hepatitis A vaccine.** / Consult your health care provider.  Hepatitis B vaccine.** / Consult your health care provider.  Haemophilus influenzae type b (Hib) vaccine.** / Consult your health care provider.  Ages 55 to 64years  Blood pressure check.** / Every 1 to 2 years.  Lipid and cholesterol check.** / Every 5 years beginning at age 20 years.  Lung cancer screening. / Every year if you are aged 55 80 years and have a 30-pack-year history of smoking and currently smoke or have quit within the past 15 years. Yearly screening is stopped once you have quit smoking for at least 15 years or develop a health problem that would prevent you from having lung cancer treatment.  Clinical breast exam.** / Every year after age 40 years.  BRCA-related cancer risk assessment.** / For women who have family members with a BRCA-related cancer (breast, ovarian, tubal, or peritoneal cancers).  Mammogram.** / Every year beginning at age 40  years and continuing for as long as you are in good health. Consult with your health care provider.  Pap test.** / Every 3 years starting at age 30 years through age 65 or 70 years with a history of 3 consecutive normal Pap tests.  HPV screening.** / Every 3 years from ages 30 years through ages 65 to 70 years with a history of 3 consecutive normal Pap tests.  Fecal occult blood test (FOBT) of stool. / Every year beginning at age 50 years and continuing until age 75 years. You may not need to do this test if you get a colonoscopy every 10 years.  Flexible sigmoidoscopy or colonoscopy.** / Every 5 years for a flexible sigmoidoscopy or every 10 years for a colonoscopy beginning at age 50 years and continuing until age 75 years.  Hepatitis C blood test.** / For all people born from 1945 through 1965 and any individual with known risks for hepatitis C.  Skin self-exam. / Monthly.  Influenza vaccine. / Every year.  Tetanus, diphtheria, and acellular pertussis (Tdap/Td) vaccine.** / Consult your health care provider. Pregnant women should receive 1 dose of Tdap vaccine during each pregnancy. 1 dose of Td every 10 years.  Varicella vaccine.** / Consult your health care provider. Pregnant females who do not have evidence of immunity should receive the first dose after pregnancy.  Zoster vaccine.** / 1 dose for adults aged 60 years or older.  Measles, mumps, rubella (MMR) vaccine.** / You need at least 1 dose of MMR if you were born in 1957 or later. You may also need a 2nd dose. For females of childbearing age, rubella immunity should be determined. If there is no evidence of immunity, females who are not pregnant should be vaccinated. If there is no evidence of immunity, females who are pregnant should delay immunization until after pregnancy.  Pneumococcal 13-valent conjugate (PCV13) vaccine.** / Consult your health care provider.  Pneumococcal polysaccharide (PPSV23) vaccine.** / 1 to 2 doses if  you smoke cigarettes or if you have certain conditions.  Meningococcal vaccine.** / Consult your health care provider.  Hepatitis A vaccine.** / Consult your health care provider.  Hepatitis B vaccine.** / Consult your health care provider.  Haemophilus influenzae type b (Hib) vaccine.** / Consult your health care provider.  Ages 65 years and over  Blood pressure check.** / Every 1 to 2 years.  Lipid and cholesterol check.** / Every 5 years beginning at age 20 years.  Lung cancer screening. / Every year if you are aged 55 80 years and have a 30-pack-year history of smoking and currently smoke or have quit within the past 15 years. Yearly screening is stopped once you have quit smoking for at least 15 years or develop a health problem that   would prevent you from having lung cancer treatment.  Clinical breast exam.** / Every year after age 103 years.  BRCA-related cancer risk assessment.** / For women who have family members with a BRCA-related cancer (breast, ovarian, tubal, or peritoneal cancers).  Mammogram.** / Every year beginning at age 36 years and continuing for as long as you are in good health. Consult with your health care provider.  Pap test.** / Every 3 years starting at age 5 years through age 85 or 10 years with 3 consecutive normal Pap tests. Testing can be stopped between 65 and 70 years with 3 consecutive normal Pap tests and no abnormal Pap or HPV tests in the past 10 years.  HPV screening.** / Every 3 years from ages 93 years through ages 70 or 45 years with a history of 3 consecutive normal Pap tests. Testing can be stopped between 65 and 70 years with 3 consecutive normal Pap tests and no abnormal Pap or HPV tests in the past 10 years.  Fecal occult blood test (FOBT) of stool. / Every year beginning at age 8 years and continuing until age 45 years. You may not need to do this test if you get a colonoscopy every 10 years.  Flexible sigmoidoscopy or colonoscopy.** /  Every 5 years for a flexible sigmoidoscopy or every 10 years for a colonoscopy beginning at age 69 years and continuing until age 68 years.  Hepatitis C blood test.** / For all people born from [birth year] through 1963 and any individual with known risks for hepatitis C.  Osteoporosis screening.** / A one-time screening for women ages 7 years and over and women at risk for fractures or osteoporosis.  Skin self-exam. / Monthly.  Influenza vaccine. / Every year.  Tetanus, diphtheria, and acellular pertussis (Tdap/Td) vaccine.** / 1 dose of Td every 10 years.  Varicella vaccine.** / Consult your health care provider.  Zoster vaccine.** / 1 dose for adults aged 5 years or older.  Pneumococcal 13-valent conjugate (PCV13) vaccine.** / Consult your health care provider.  Pneumococcal polysaccharide (PPSV23) vaccine.** / 1 dose for all adults aged 74 years and older.  Meningococcal vaccine.** / Consult your health care provider.  Hepatitis A vaccine.** / Consult your health care provider.  Hepatitis B vaccine.** / Consult your health care provider.  Haemophilus influenzae type b (Hib) vaccine.** / Consult your health care provider. ** Family history and personal history of risk and conditions may change your health care provider's recommendations. Document Released: 11/15/2001 Document Revised: 07/10/2013  [facility] Patient Information 2014 [place], [place].   EXERCISE AND DIET:  We recommended that you start or continue a regular exercise program for good health. Regular exercise means any activity that makes your heart beat faster and makes you sweat.  We recommend exercising at least 30 minutes per day at least 3 days a week, preferably 5.  We also recommend a diet low in fat and sugar / carbohydrates.  Inactivity, poor dietary choices and obesity can cause diabetes, heart attack, stroke, and kidney damage, among others.     ALCOHOL AND SMOKING:  Women should limit their alcohol intake to no  more than 7 drinks/beers/glasses of wine (combined, not each!) per week. Moderation of alcohol intake to this level decreases your risk of breast cancer and liver damage.  ( And of course, no recreational drugs are part of a healthy lifestyle.)  Also, you should not be smoking at all or even being exposed to second hand smoke. Most people know smoking can  cause cancer, and various heart and lung diseases, but did you know it also contributes to weakening of your bones?  Aging of your skin?  Yellowing of your teeth and nails?   CALCIUM AND VITAMIN D:  Adequate intake of calcium and Vitamin D are recommended.  The recommendations for exact amounts of these supplements seem to change often, but generally speaking 600 mg of calcium (either carbonate or citrate) and 800 units of Vitamin D per day seems prudent. Certain women may benefit from higher intake of Vitamin D.  If you are among these women, your doctor will have told you during your visit.     PAP SMEARS:  Pap smears, to check for cervical cancer or precancers,  have traditionally been done yearly, although recent scientific advances have shown that most women can have pap smears less often.  However, every woman still should have a physical exam from her gynecologist or primary care physician every year. It will include a breast check, inspection of the vulva and vagina to check for abnormal growths or skin changes, a visual exam of the cervix, and then an exam to evaluate the size and shape of the uterus and ovaries.  And after 41 years of age, a rectal exam is indicated to check for rectal cancers. We will also provide age appropriate advice regarding health maintenance, like when you should have certain vaccines, screening for sexually transmitted diseases, bone density testing, colonoscopy, mammograms, etc.    MAMMOGRAMS:  All women over 46 years old should have a yearly mammogram. Many facilities now offer a "3D" mammogram, which may cost  around $50 extra out of pocket. If possible,  we recommend you accept the option to have the 3D mammogram performed.  It both reduces the number of women who will be called back for extra views which then turn out to be normal, and it is better than the routine mammogram at detecting truly abnormal areas.     COLONOSCOPY:  Colonoscopy to screen for colon cancer is recommended for all women at age 17.  We know, you hate the idea of the prep.  We agree, BUT, having colon cancer and not knowing it is worse!!  Colon cancer so often starts as a polyp that can be seen and removed at colonscopy, which can quite literally save your life!  And if your first colonoscopy is normal and you have no family history of colon cancer, most women don't have to have it again for 10 years.  Once every ten years, you can do something that may end up saving your life, right?  We will be happy to help you get it scheduled when you are ready.  Be sure to check your insurance coverage so you understand how much it will cost.  It may be covered as a preventative service at no cost, but you should check your particular policy.     Top Ten Foods for Health  1. Water Drink at least 8 to 12 cups of water daily. Consume half of your body weight in pounds, is the amount of water in ounces to drink daily.  Ie: a 200lb person = 100 oz water daily  2. Dark Green Vegetables Eat dark green vegetables at least three to four times a week. Good options include broccoli, peppers, brussel sprouts and leafy greens like kale and spinach.  3. Whole Grains Whole grains should be included in your diet at least two to three times daily. Look for whole wheat  flour, rye, oatmeal, barley, amaranth, quinoa or a multigrain. A good source of fiber includes 3 to 4 grams of fiber per serving. A great source has 5 or more grams of fiber per serving.  4. Beans and Lentils Try to eat a bean-based meal at least once a week. Try to add legumes, including  beans and lentils, to soups, stews, casseroles, salads and dips or eat them plain.  5. Fish Try to eat two to three serving of fish a week. A serving consists of 3 to 4 ounces of cooked fish. Good choices are salmon, trout, herring, bluefish, sardines and tuna.  6. Berries Include two to four servings of fruit in your diet each day. Try to eat berries such as raspberries, blueberries, blackberries and strawberries.  7. Winter Squash Eat butternut and acorn squash as well as other richly pigmented dark orange and green colored vegetables like sweet potato, cantaloupe and mango.  8. Soy 25 grams of soy protein a day is recommended as part of a low-fat diet to help lower cholesterol levels. Try tofu, soymilk, edamame soybeans, tempeh and texturized vegetable protein (TVP).  9. Flaxseed, Nuts and Seeds Add 1 to 2 tablespoons of ground flaxseed or other seeds to food each day or include a moderate amount of nuts - 1/4 cup - in your daily diet.  10. Organic Yogurt Men and women between 32 and 49 years of age need 1000 milligrams of calcium a day and 1200 milligrams if 88 or older. Eat calcium-rich foods such as nonfat or low-fat dairy products three to four times a day. Include organic choices.

## 2018-11-07 NOTE — Progress Notes (Signed)
Impression and Recommendations:    1. Healthcare maintenance   2. Encounter for health education     - Fasting blood work drawn today.  1) Anticipatory Guidance: Discussed importance of wearing a seatbelt while driving, not texting while driving; sunscreen when outside along with yearly skin surveillance; eating a well balanced and modest diet; physical activity at least 25 minutes per day or 150 min/ week of moderate to intense activity.  - Discussed prudent skin screening habits with patient today.  - Advised patient to obtain a Fitbit to track her resting heart rate at home.  2) Immunizations / Screenings / Labs:  All immunizations and screenings that patient agrees to, are up-to-date per recommendations or will be updated today.  Patient understands the needs for q 69mo dental and yearly vision screens which pt will schedule independently. Obtain CBC, CMP, HgA1c, Lipid panel, TSH and vit D when fasting if not already done recently.   - Continue to follow up with OBGYN Pamela King. - Pap smear last done in January 2020.  Patient obtains her pap smears yearly. - Scheduled for mammogram this 18th of February 2020.  - Discussed need for patient to become established with an eye doctor in the area.  - Referral placed for dermatology today.  4) BMI Counseling - BMI in Normal Range, 24.94 Explained to patient what BMI refers to, and what it means medically.  Encouraged patient to remain in the normal BMI range.  Told patient to think about it as a "medical risk stratification measurement" and how increasing BMI is associated with increasing risk/ or worsening state of various diseases such as hypertension, hyperlipidemia, diabetes, premature OA, depression etc.  - Improve nutrient density of diet through increasing intake of fruits and vegetables and decreasing saturated/trans fats, white flour products and refined sugar products.   - American Heart Association guidelines for  healthy diet, basically Mediterranean diet, and exercise guidelines of 30 minutes 5 days per week or more discussed in detail.  - Health counseling performed.  All questions answered.  5) Lifestyle & Preventative Health Maintenance - Advised patient to continue working toward regular exercising to improve overall mental, physical, and emotional health.  Begin with 10-15 minutes of exercise per day.  - Encouraged patient to engage in daily physical activity, especially a formal exercise routine.  Recommended that the patient eventually strive for at least 150 minutes of moderate cardiovascular activity per week according to guidelines established by the [facility].   - Healthy dietary habits encouraged, including low-carb, and high amounts of lean protein in diet.   - Patient should also consume adequate amounts of water.   Orders Placed This Encounter  Procedures  . CBC with Differential/Platelet  . Comprehensive metabolic panel  . Hemoglobin A1c  . Lipid panel  . T4, free  . TSH  . VITAMIN D 25 Hydroxy (Vit-D Deficiency, Fractures)    Gross side effects, risk and benefits, and alternatives of medications discussed with patient.  Patient is aware that all medications have potential side effects and we are unable to predict every side effect or drug-drug interaction that may occur.  Expresses verbal understanding and consents to current therapy plan and treatment regimen.  F-up preventative CPE in 1 year. F/up sooner for chronic care management as discussed and/or prn.  Please see orders placed and AVS handed out to patient at the end of our visit for further patient instructions/ counseling done pertaining to today's office visit.   This  document serves as a record of services personally performed by Thomasene Loteborah Michaelyn Wall, DO. It was created on her behalf by Peggye FothergillKatherine Galloway, a trained medical scribe. The creation of this record is based on the scribe's personal observations and the provider's  statements to them.   I have reviewed the above medical documentation for accuracy and completeness and I concur.  Thomasene Loteborah Britney Captain, DO 11/07/2018 9:44 AM        Subjective:    Chief Complaint  Patient presents with  . Annual Exam   CC:   HPI: Pamela King is a [age] y.o. female [facility] a yearly health maintenance exam.  Health Maintenance Summary Reviewed and updated, unless pt declines services.  Tobacco History Reviewed:  Y; never smoker. Alcohol:  No concerns, no excessive use. Exercise Habits:  Not meeting AHA guidelines.  Active with the kids, going hiking, walking the dogs. STD concerns:  None. Drug Use:  None. Birth control method:  Lo Seasonique. Menses regular:  Yes. Lumps or breast concerns:  No. Breast Cancer Family History:  Paternal grandmother with breast cancer at "not too young" of an age.  OBGYN is Oscar LaJertson; just had her pap smear done, January 2020.  Scheduled for mammogram this 18th of February 2020.  Baseline High Heart Rate - Family History of CAD Notes that her heart rate is always high.  When she's hanging out at home, it's "probably in the 80's."  Her father had a heart attack at age 41.  Had three stents put in.  Had his first "cardiac event" at age 41-56.  Visual Health Denies any visual concerns.  Last had an eye exam before she moved from [place], sometime earlier in 2019.  Dental Health Going to visit dentist next month.  Digestive Health Denies constipation, diarrhea, cramping; confirms having regular bowel movements.  Says they try to eat raw veggies.  Isn't sure how much fiber she consumes.   Immunization History  Administered Date(s) Administered  . Tdap 10/09/2018    Health Maintenance  Topic Date Due  . INFLUENZA VACCINE  07/04/2019 (Originally 05/03/2018)  . PAP SMEAR-Modifier  10/09/2021  . TETANUS/TDAP  10/09/2028  . HIV Screening  Completed     Wt Readings from Last 3  Encounters:  11/07/18 149 lb 14.4 oz (68 kg)  10/09/18 147 lb (66.7 kg)  09/21/18 144 lb 11.2 oz (65.6 kg)   BP Readings from Last 3 Encounters:  11/07/18 115/77  10/09/18 112/70  09/21/18 108/75   Pulse Readings from Last 3 Encounters:  11/07/18 78  10/09/18 80  09/21/18 85     Past Medical History:  Diagnosis Date  . Dysmenorrhea   . Endometriosis   . Infertility, female   . Urinary incontinence       Past Surgical History:  Procedure Laterality Date  . GANGLION CYST EXCISION Bilateral   . HYSTEROSCOPY    . OTHER SURGICAL HISTORY     Repair of bicornuate uterus  . REFRACTIVE SURGERY  2001      Family History  Problem Relation Age of Onset  . Diabetes Father 6140  . High Cholesterol Father   . Hypertension Father   . Alcohol abuse Maternal Grandfather   . Diabetes Brother 20  . Hyperlipidemia Brother   . Obesity Brother   . Diabetes Brother 20  . Hyperlipidemia Brother   . Obesity Brother       Social History   Substance and Sexual Activity  Drug Use Never  ,   Social History   Substance and Sexual Activity  Alcohol Use Yes  . Alcohol/week: 1.0 - 2.0 standard drinks  . Types: 1 - 2 Standard drinks or equivalent per week  ,   Social History   Tobacco Use  Smoking Status Never Smoker  Smokeless Tobacco Never Used  ,   Social History   Substance and Sexual Activity  Sexual Activity Yes  . Birth control/protection: Pill    Current Outpatient Medications on File Prior to Visit  Medication Sig Dispense Refill  . CANNABIDIOL PO Take by mouth.    . Fexofenadine HCl (ALLEGRA PO) Take 1 tablet by mouth daily.    . Levonorgestrel-Ethinyl Estradiol (LOSEASONIQUE) 0.1-0.02 & 0.01 MG tablet Take 1 tablet by mouth daily. 1 Package 4  . Multiple Vitamin (MULTIVITAMIN) tablet Take 1 tablet by mouth daily.    . naproxen sodium (ALEVE) 220 MG tablet Take 220 mg by mouth.    . Omega-3 Fatty Acids (FISH OIL PO) Take 1 capsule by mouth daily.     No  current facility-administered medications on file prior to visit.     Allergies: Patient has no known allergies.  Review of Systems: General:   Denies fever, chills, unexplained weight loss.  Optho/Auditory:   Denies visual changes, blurred vision/LOV Respiratory:   Denies SOB, DOE more than baseline levels.  Cardiovascular:   Denies chest pain, palpitations, new onset peripheral edema  Gastrointestinal:   Denies nausea, vomiting, diarrhea.  Genitourinary: Denies dysuria, freq/ urgency, flank pain or discharge from genitals.  Endocrine:     Denies hot or cold intolerance, polyuria, polydipsia. Musculoskeletal:   Denies unexplained myalgias, joint swelling, unexplained arthralgias, gait problems.  Skin:  Denies rash, suspicious lesions Neurological:     Denies dizziness, unexplained weakness, numbness  Psychiatric/Behavioral:   Denies mood changes, suicidal or homicidal ideations, hallucinations    Objective:    Blood pressure 115/77, pulse 78, temperature 98.3 F (36.8 C), height 5\' 5"  (1.651 m), weight 149 lb 14.4 oz (68 kg), SpO2 100 %. Body mass index is 24.94 kg/m. General Appearance:    Alert, cooperative, no distress, appears stated age  Head:    Normocephalic, without obvious abnormality, atraumatic  Eyes:    PERRL, conjunctiva/corneas clear, EOM's intact, fundi    benign, both eyes  Ears:    Normal TM's and external ear canals, both ears  Nose:   Nares normal, septum midline, mucosa normal, no drainage    or sinus tenderness  Throat:   Lips w/o lesion, mucosa moist, and tongue normal; teeth and   gums normal  Neck:   Supple, symmetrical, trachea midline, no adenopathy;    thyroid:  no enlargement/tenderness/nodules; no carotid   bruit or JVD  Back:     Symmetric, no curvature, ROM normal, no CVA tenderness  Lungs:     Clear to auscultation bilaterally, respirations unlabored, no       Wh/ R/ R  Chest Wall:    No tenderness or gross deformity; normal excursion   Heart:     Regular rate and rhythm, S1 and S2 normal, no murmur, rub   or gallop  [facility]  Abdomen:     Soft, non-tender, bowel sounds active all four quadrants, NO   G/R/R, no masses, no organomegaly  [facility]   [facility]   Rectal:   Deferred.  Extremities:   Extremities normal, atraumatic, no cyanosis or gross  edema  Pulses:   2+ and symmetric all extremities  Skin:   Warm, dry, Skin color, texture, turgor normal, no obvious rashes or lesions Psych: No HI/SI, judgement and insight good, Euthymic mood. Full Affect.  Neurologic:   CNII-XII intact, normal strength, sensation and reflexes    Throughout

## 2018-11-08 LAB — COMPREHENSIVE METABOLIC PANEL
ALT: 11 IU/L (ref 0–32)
AST: 14 IU/L (ref 0–40)
Albumin/Globulin Ratio: 1.6 (ref 1.2–2.2)
Albumin: 3.9 g/dL (ref 3.8–4.8)
Alkaline Phosphatase: 46 IU/L (ref 39–117)
BILIRUBIN TOTAL: 1.1 mg/dL (ref 0.0–1.2)
BUN/Creatinine Ratio: 16 (ref 9–23)
BUN: 12 mg/dL (ref 6–24)
CO2: 22 mmol/L (ref 20–29)
Calcium: 9.4 mg/dL (ref 8.7–10.2)
Chloride: 101 mmol/L (ref 96–106)
Creatinine, Ser: 0.74 mg/dL (ref 0.57–1.00)
GFR calc Af Amer: 117 mL/min/{1.73_m2} (ref >59)
GFR calc non Af Amer: 102 mL/min/{1.73_m2} (ref >59)
Globulin, Total: 2.4 g/dL (ref 1.5–4.5)
Glucose: 84 mg/dL (ref 65–99)
Potassium: 4.1 mmol/L (ref 3.5–5.2)
SODIUM: 138 mmol/L (ref 134–144)
Total Protein: 6.3 g/dL (ref 6.0–8.5)

## 2018-11-08 LAB — VITAMIN D 25 HYDROXY (VIT D DEFICIENCY, FRACTURES): Vit D, 25-Hydroxy: 47.7 ng/mL (ref 30.0–100.0)

## 2018-11-08 LAB — CBC WITH DIFFERENTIAL/PLATELET
Basophils Absolute: 0.1 10*3/uL (ref 0.0–0.2)
Basos: 1 %
EOS (ABSOLUTE): 0.1 10*3/uL (ref 0.0–0.4)
Eos: 3 %
Hematocrit: 40.2 % (ref 34.0–46.6)
Hemoglobin: 13.6 g/dL (ref 11.1–15.9)
IMMATURE GRANS (ABS): 0 10*3/uL (ref 0.0–0.1)
Immature Granulocytes: 0 %
Lymphocytes Absolute: 1.6 10*3/uL (ref 0.7–3.1)
Lymphs: 34 %
MCH: 32.2 pg (ref 26.6–33.0)
MCHC: 33.8 g/dL (ref 31.5–35.7)
MCV: 95 fL (ref 79–97)
Monocytes Absolute: 0.4 10*3/uL (ref 0.1–0.9)
Monocytes: 8 %
Neutrophils Absolute: 2.6 10*3/uL (ref 1.4–7.0)
Neutrophils: 54 %
Platelets: 271 10*3/uL (ref 150–450)
RBC: 4.22 x10E6/uL (ref 3.77–5.28)
RDW: 11.6 % — ABNORMAL LOW (ref 11.7–15.4)
WBC: 4.8 10*3/uL (ref 3.4–10.8)

## 2018-11-08 LAB — LIPID PANEL
Chol/HDL Ratio: 3.4 ratio (ref 0.0–4.4)
Cholesterol, Total: 182 mg/dL (ref 100–199)
HDL: 54 mg/dL (ref >39)
LDL Calculated: 101 mg/dL — ABNORMAL HIGH (ref 0–99)
Triglycerides: 135 mg/dL (ref 0–149)
VLDL Cholesterol Cal: 27 mg/dL (ref 5–40)

## 2018-11-08 LAB — TSH: TSH: 3.84 u[IU]/mL (ref 0.450–4.500)

## 2018-11-08 LAB — T4, FREE: Free T4: 1.13 ng/dL (ref 0.82–1.77)

## 2018-11-08 LAB — HEMOGLOBIN A1C
ESTIMATED AVERAGE GLUCOSE: 88 mg/dL
Hgb A1c MFr Bld: 4.7 % — ABNORMAL LOW (ref 4.8–5.6)

## 2018-11-20 ENCOUNTER — Ambulatory Visit
Admission: RE | Admit: 2018-11-20 | Discharge: 2018-11-20 | Disposition: A | Discharge status: Home / Self Care | Payer: BLUE CROSS/BLUE SHIELD | Source: Ambulatory Visit | Attending: Family Medicine | Admitting: Family Medicine

## 2018-11-20 DIAGNOSIS — Z1231 Encounter for screening mammogram for malignant neoplasm of breast: Secondary | ICD-10-CM | POA: Diagnosis not present

## 2018-12-03 ENCOUNTER — Telehealth: Payer: Self-pay | Admitting: Obstetrics and Gynecology

## 2018-12-03 MED ORDER — LEVONORGEST-ETH ESTRAD 91-DAY 0.15-0.03 &0.01 MG PO TABS: 1.0000 | ORAL_TABLET | Freq: Every day | ORAL | 3 refills | Status: DC

## 2018-12-03 NOTE — Telephone Encounter (Signed)
Patient has been bleeding and spotting for three weeks since starting her birth control. She would like to try a different birth control. [facility] [address]

## 2018-12-03 NOTE — Telephone Encounter (Signed)
Spoke with patient. Patient has been on loseasonique for 6.5wks, spotting for 3ks. Denies late or missed pills, takes roughly same time daily. States she was on a high dose OCP previously, requesting to change to "0.15 mg 3 month pill to help with bleeding". Advised patient can take 3 months for cycles to regulate with new OCP, recommended patient continue for 3 months. Patient states she would like to change OCP now, does not want to wait. Advised I will review with Dr. Oscar La and return call with recommendations. Patient agreeable.    Dr. Oscar La -please advise on changing to [place].

## 2018-12-03 NOTE — Telephone Encounter (Signed)
Spoke with patient, advised as seen below per dr. Oscar La. Patient request Rx for [facility] to pharmacy on file. Patient verbalizes understanding and is agreeable.   Encounter closed.

## 2018-12-03 NOTE — Telephone Encounter (Signed)
If she wants a 3 month pill you can change her to seasonique. She could have some BTB with any 3 month or continuous pill. The pill she was on before was a 50 mcg pill which I would not recommend (that is a high dose pill with increased risks)

## 2019-05-08 ENCOUNTER — Telehealth: Payer: Self-pay | Admitting: Obstetrics and Gynecology

## 2019-05-08 NOTE — Telephone Encounter (Signed)
Patient is having some vaginal redness and swelling. To triage to assist with scheduling with Dr.Jertson.

## 2019-05-08 NOTE — Telephone Encounter (Signed)
Call to patient. Patient states she can't describe the swelling, just that "it feels swollen." States "I couldn't tell you when it started." States it is all internal and tender to touch. Has used a mirror to examine and states the entrance to her vagina lookss really red. Denies itching, discharge, fever or dysuria. OV recommended. Patient scheduled for 05-09-2019 at 1415. Patient agreeable to date and time of appointment.   Routing to provider and will close encounter.

## 2019-05-09 ENCOUNTER — Encounter: Payer: Self-pay | Admitting: Obstetrics and Gynecology

## 2019-05-09 ENCOUNTER — Ambulatory Visit (INDEPENDENT_AMBULATORY_CARE_PROVIDER_SITE_OTHER): Payer: BC Managed Care – PPO | Admitting: Obstetrics and Gynecology

## 2019-05-09 ENCOUNTER — Other Ambulatory Visit: Payer: Self-pay

## 2019-05-09 VITALS — BP 108/68 | HR 78 | Temp 98.1°F | Resp 14 | Ht 65.0 in | Wt 149.5 lb

## 2019-05-09 DIAGNOSIS — N3946 Mixed incontinence: Secondary | ICD-10-CM | POA: Diagnosis not present

## 2019-05-09 DIAGNOSIS — N949 Unspecified condition associated with female genital organs and menstrual cycle: Secondary | ICD-10-CM

## 2019-05-09 NOTE — Patient Instructions (Signed)

## 2019-05-09 NOTE — Progress Notes (Signed)
GYNECOLOGY  VISIT   HPI: 43 y.o.   Married White or Caucasian Not Hispanic or Latino  female   [phone] with Patient's last menstrual period was 02/15/2019.   here for   Vaginal swelling and tenderness. She has noticed it for years, mainly notices it with her cycle. Feels funny when she inserts a tampon. She notices an anterior bulge only when she is inserting a tampon. No itching burning or irritation.   She c/o mixed urinary incontinence, urgency is worse. She is leaking a couple of times a week.   GYNECOLOGIC HISTORY: Patient's last menstrual period was 02/15/2019. Contraception:OCP Menopausal hormone therapy: none        OB History    Gravida  2   Para  2   Term  2   Preterm      AB      Living  2     SAB      TAB      Ectopic      Multiple      Live Births  2              Patient Active Problem List   Diagnosis Date Noted  . H/O endometritis 09/23/2018  . Arthritis- in hands/ fingers- since early 20's 09/23/2018  . 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 FH: early coronary artery disease-   09/23/2018  . Family history of hypertension- in 20's -30's  - father and brother 09/23/2018  . Family history of hyperlipidemia-  in 20's -30's  - father and brother 09/23/2018  . Uses birth control- daily 09/21/2018  . 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 Dysmenorrhea   . Endometriosis   . Infertility, female   . Urinary incontinence     Past Surgical History:  Procedure Laterality Date  . GANGLION CYST EXCISION Bilateral   . HYSTEROSCOPY    . OTHER SURGICAL HISTORY     Repair of bicornuate uterus  . REFRACTIVE SURGERY  2001    Current Outpatient Medications  Medication Sig Dispense Refill  . CANNABIDIOL PO Take by mouth.    . Fexofenadine HCl (ALLEGRA PO) Take 1 tablet by mouth daily.    . Levonorgestrel-Ethinyl Estradiol (SEASONIQUE) 0.15-0.03 &0.01 MG tablet Take 1 tablet  by mouth daily. 1 Package 3  . Multiple Vitamin (MULTIVITAMIN) tablet Take 1 tablet by mouth daily.    . naproxen sodium (ALEVE) 220 MG tablet Take 220 mg by mouth.    . Omega-3 Fatty Acids (FISH OIL PO) Take 1 capsule by mouth daily.     No current facility-administered medications for this visit.      ALLERGIES: Patient has no known allergies.  Family History  Problem Relation Age of Onset  . Diabetes Father 77  . High Cholesterol Father   . Hypertension Father   . Alcohol abuse Maternal Grandfather   . Diabetes Brother 20  . Hyperlipidemia Brother   . Obesity Brother   . Diabetes Brother 20  . Hyperlipidemia Brother   . Obesity Brother   . Breast cancer Paternal Grandmother     Social History   Socioeconomic History  . Marital status: Married    Spouse name: Not on file  . Number of children: Not on file  . Years of education: Not on file  . Highest education level: Not on file  Occupational History  . Not on file  Social Needs  .  Financial resource strain: Not on file  . Food insecurity    Worry: Not on file    Inability: Not on file  . Transportation needs    Medical: Not on file    Non-medical: Not on file  Tobacco Use  . Smoking status: Never Smoker  . Smokeless tobacco: Never Used  Substance and Sexual Activity  . Alcohol use: Yes    Alcohol/week: 1.0 - 2.0 standard drinks    Types: 1 - 2 Standard drinks or equivalent per week  . Drug use: Never  . Sexual activity: Yes    Birth control/protection: Pill  Lifestyle  . Physical activity    Days per week: Not on file    Minutes per session: Not on file  . Stress: Not on file  Relationships  . Social Musicianconnections    Talks on phone: Not on file    Gets together: Not on file    Attends religious service: Not on file    Active member of club or organization: Not on file    Attends meetings of clubs or organizations: Not on file    Relationship status: Not on file  . Intimate partner violence    Fear  of current or ex partner: Not on file    Emotionally abused: Not on file    Physically abused: Not on file    Forced sexual activity: Not on file  Other Topics Concern  . Not on file  Social History Narrative  . Not on file    Review of Systems  Constitutional: Negative.   HENT: Negative.   Eyes: Negative.   Respiratory: Negative.   Cardiovascular: Negative.   Gastrointestinal: Negative.   Genitourinary:       Intermittent vaginal swelling and tenderness Redness   Musculoskeletal: Negative.   Skin: Negative.   Neurological: Negative.   Endo/Heme/Allergies: Negative.   Psychiatric/Behavioral: Negative.     PHYSICAL EXAMINATION:    BP 108/68 (BP Location: Right Arm, Patient Position: Sitting, Cuff Size: Normal)   Pulse 78   Temp 98.1 F (36.7 C) (Skin)   Resp 14   Ht 5\' 5"  (1.651 m)   Wt 149 lb 8 oz (67.8 kg)   LMP 02/15/2019   BMI 24.88 kg/m     General appearance: alert, cooperative and appears stated age  Pelvic: External genitalia:  no lesions              Urethra:  normal appearing urethra with no masses, tenderness or lesions              Bartholins and Skenes: normal                 Vagina: normal appearing vagina with normal color and discharge, no lesions. No significant prolapse, examined supine and standing with and without valsalva. The patient looked with a mirror, states she doesn't see the bulge, it is up inside her vagina.              Cervix: no cervical motion tenderness and no lesions              Bimanual Exam:  Uterus:  normal size, contour, position, consistency, mobility, non-tender              Adnexa: no mass, fullness, tenderness               Chaperone was present for exam.  ASSESSMENT C/O vaginal swelling, no obvious prolapse on exam, normal exam. Patient reassured.  Mixed incontinence    PLAN Information on prolapse given and explained Information on incontinence given Send urine for ua, c&s Referral to PT placed.   An After  Visit Summary was printed and given to the patient.

## 2019-05-10 LAB — URINALYSIS, MICROSCOPIC ONLY: Casts: NONE SEEN /lpf

## 2019-05-11 LAB — URINE CULTURE: Organism ID, Bacteria: NO GROWTH

## 2019-05-28 DIAGNOSIS — R102 Pelvic and perineal pain: Secondary | ICD-10-CM | POA: Diagnosis not present

## 2019-05-28 DIAGNOSIS — M6281 Muscle weakness (generalized): Secondary | ICD-10-CM | POA: Diagnosis not present

## 2019-05-28 DIAGNOSIS — M62838 Other muscle spasm: Secondary | ICD-10-CM | POA: Diagnosis not present

## 2019-05-28 DIAGNOSIS — N3946 Mixed incontinence: Secondary | ICD-10-CM | POA: Diagnosis not present

## 2019-05-30 DIAGNOSIS — N3946 Mixed incontinence: Secondary | ICD-10-CM | POA: Diagnosis not present

## 2019-05-30 DIAGNOSIS — M62838 Other muscle spasm: Secondary | ICD-10-CM | POA: Diagnosis not present

## 2019-05-30 DIAGNOSIS — M6281 Muscle weakness (generalized): Secondary | ICD-10-CM | POA: Diagnosis not present

## 2019-05-30 DIAGNOSIS — R102 Pelvic and perineal pain: Secondary | ICD-10-CM | POA: Diagnosis not present

## 2019-06-03 DIAGNOSIS — N3946 Mixed incontinence: Secondary | ICD-10-CM | POA: Diagnosis not present

## 2019-06-03 DIAGNOSIS — M62838 Other muscle spasm: Secondary | ICD-10-CM | POA: Diagnosis not present

## 2019-06-03 DIAGNOSIS — M6281 Muscle weakness (generalized): Secondary | ICD-10-CM | POA: Diagnosis not present

## 2019-06-03 DIAGNOSIS — R102 Pelvic and perineal pain: Secondary | ICD-10-CM | POA: Diagnosis not present

## 2019-06-26 ENCOUNTER — Other Ambulatory Visit: Payer: Self-pay

## 2019-06-26 DIAGNOSIS — Z20822 Contact with and (suspected) exposure to covid-19: Secondary | ICD-10-CM

## 2019-06-26 DIAGNOSIS — R6889 Other general symptoms and signs: Secondary | ICD-10-CM | POA: Diagnosis not present

## 2019-06-28 LAB — NOVEL CORONAVIRUS, NAA: SARS-CoV-2, NAA: NOT DETECTED

## 2019-07-11 DIAGNOSIS — N3946 Mixed incontinence: Secondary | ICD-10-CM | POA: Diagnosis not present

## 2019-07-11 DIAGNOSIS — M62838 Other muscle spasm: Secondary | ICD-10-CM | POA: Diagnosis not present

## 2019-07-11 DIAGNOSIS — R102 Pelvic and perineal pain: Secondary | ICD-10-CM | POA: Diagnosis not present

## 2019-07-11 DIAGNOSIS — M6281 Muscle weakness (generalized): Secondary | ICD-10-CM | POA: Diagnosis not present

## 2019-08-05 DIAGNOSIS — M6281 Muscle weakness (generalized): Secondary | ICD-10-CM | POA: Diagnosis not present

## 2019-08-05 DIAGNOSIS — N3946 Mixed incontinence: Secondary | ICD-10-CM | POA: Diagnosis not present

## 2019-08-05 DIAGNOSIS — M62838 Other muscle spasm: Secondary | ICD-10-CM | POA: Diagnosis not present

## 2019-08-05 DIAGNOSIS — R102 Pelvic and perineal pain: Secondary | ICD-10-CM | POA: Diagnosis not present

## 2019-09-05 DIAGNOSIS — M62838 Other muscle spasm: Secondary | ICD-10-CM | POA: Diagnosis not present

## 2019-09-05 DIAGNOSIS — R102 Pelvic and perineal pain: Secondary | ICD-10-CM | POA: Diagnosis not present

## 2019-09-05 DIAGNOSIS — M6281 Muscle weakness (generalized): Secondary | ICD-10-CM | POA: Diagnosis not present

## 2019-09-05 DIAGNOSIS — N3946 Mixed incontinence: Secondary | ICD-10-CM | POA: Diagnosis not present

## 2019-09-19 DIAGNOSIS — N3946 Mixed incontinence: Secondary | ICD-10-CM | POA: Diagnosis not present

## 2019-09-19 DIAGNOSIS — R102 Pelvic and perineal pain: Secondary | ICD-10-CM | POA: Diagnosis not present

## 2019-09-19 DIAGNOSIS — M6281 Muscle weakness (generalized): Secondary | ICD-10-CM | POA: Diagnosis not present

## 2019-09-19 DIAGNOSIS — M62838 Other muscle spasm: Secondary | ICD-10-CM | POA: Diagnosis not present

## 2019-10-14 NOTE — Progress Notes (Signed)
44 y.o. G104P2002 Married White or Caucasian Not Hispanic or Latino female here for annual exam.  She is on [place]. Menses q 3 months x 5 days, uses a menstrual cup changes it 2 x a day. Minor cramps. Sexually active, some dryness.   H/O mixed urinary incontinence. Went to PT last year, much better. Tolerable, doesn't remember the last time she leaked.     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 She went on to have 2 FTNSVD's.   No LMP recorded. (Menstrual status: Oral contraceptives).          Sexually active: Yes.    The current method of family planning is OCP (estrogen/progesterone).    Exercising: Yes.    Swimming and walking  Smoker:  no  Health Maintenance: Pap:  10/09/2018 normal, hpv negative  History of abnormal Pap:  Yes years ago. Repeat was normal.  MMG:  11/26/18 Density C Bi-rads 1 neg  TDaP:  10/09/2018 Gardasil: no   reports that she has never smoked. She has never used smokeless tobacco. She reports current alcohol use of about 1.0 - 2.0 standard drinks of alcohol per week. She reports that she does not use drugs. 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 78 year old son and almost 33 year old daughter.   Past Medical History:  Diagnosis Date  . Dysmenorrhea   . Endometriosis   . Infertility, female   . Urinary incontinence     Past Surgical History:  Procedure Laterality Date  . GANGLION CYST EXCISION Bilateral   . HYSTEROSCOPY    . OTHER SURGICAL HISTORY     Repair of bicornuate uterus  . REFRACTIVE SURGERY  2001    Current Outpatient Medications  Medication Sig Dispense Refill  . CANNABIDIOL PO Take by mouth.    . Fexofenadine HCl (ALLEGRA PO) Take 1 tablet by mouth daily.    . Levonorgestrel-Ethinyl Estradiol (SEASONIQUE) 0.15-0.03 &0.01 MG tablet Take 1 tablet by mouth daily. 1 Package 3  . Multiple Vitamin (MULTIVITAMIN) tablet Take 1 tablet by mouth daily.    . naproxen sodium (ALEVE) 220  MG tablet Take 220 mg by mouth.    . Omega-3 Fatty Acids (FISH OIL PO) Take 1 capsule by mouth daily.     No current facility-administered medications for this visit.    Family History  Problem Relation Age of Onset  . Diabetes Father 39  . High Cholesterol Father   . Hypertension Father   . Alcohol abuse Maternal Grandfather   . Diabetes Brother 20  . Hyperlipidemia Brother   . Obesity Brother   . Diabetes Brother 20  . Hyperlipidemia Brother   . Obesity Brother   . Breast cancer Paternal Grandmother     Review of Systems  Constitutional: Negative.   HENT: Negative.   Eyes: Negative.   Respiratory: Negative.   Cardiovascular: Negative.   Gastrointestinal: Negative.   Endocrine: Negative.   Genitourinary: Negative.   Musculoskeletal: Negative.   Skin: Negative.   Allergic/Immunologic: Negative.   Neurological: Negative.   Hematological: Negative.   Psychiatric/Behavioral: Negative.     Exam:   BP 118/68   Pulse 64   Temp 97.7 F (36.5 C)   Ht 5\' 5"  (1.651 m)   Wt 151 lb (68.5 kg)   SpO2 99%   BMI 25.13 kg/m   Weight change: @WEIGHTCHANGE @ Height:   Height: 5\' 5"  (165.1 cm)  Ht Readings from Last 3  Encounters:  10/16/19 5\' 5"  (1.651 m)  05/09/19 5\' 5"  (1.651 m)  11/07/18 5\' 5"  (1.651 m)    General appearance: alert, cooperative and appears stated age Head: Normocephalic, without obvious abnormality, atraumatic Neck: no adenopathy, supple, symmetrical, trachea midline and thyroid normal to inspection and palpation Lungs: clear to auscultation bilaterally Cardiovascular: regular rate and rhythm Breasts: normal appearance, no masses or tenderness Abdomen: soft, non-tender; non distended,  no masses,  no organomegaly Extremities: extremities normal, atraumatic, no cyanosis or edema Skin: Skin color, texture, turgor normal. No rashes or lesions Lymph nodes: Cervical, supraclavicular, and axillary nodes normal. No abnormal inguinal nodes palpated Neurologic:  Grossly normal   Pelvic: External genitalia:  no lesions              Urethra:  normal appearing urethra with no masses, tenderness or lesions              Bartholins and Skenes: normal                 Vagina: normal appearing vagina with normal color and discharge, no lesions              Cervix: no lesions               Bimanual Exam:  Uterus:  normal size, contour, position, consistency, mobility, non-tender              Adnexa: no mass, fullness, tenderness               Rectovaginal: Confirms               Anus:  normal sphincter tone, no lesions  chaperoned for the exam.  A:  Well Woman with normal exam  Mixed urinary incontinence, improved with PT  Contraception surveillance   P:   Labs with primary MD  No pap this year  Mammogram due next month  Discussed breast self exam  Discussed calcium and vit D intake  Continue OCP's

## 2019-10-15 ENCOUNTER — Other Ambulatory Visit: Payer: Self-pay

## 2019-10-16 ENCOUNTER — Encounter: Payer: Self-pay | Admitting: Obstetrics and Gynecology

## 2019-10-16 ENCOUNTER — Ambulatory Visit (INDEPENDENT_AMBULATORY_CARE_PROVIDER_SITE_OTHER): Payer: BC Managed Care – PPO | Admitting: Obstetrics and Gynecology

## 2019-10-16 VITALS — BP 118/68 | HR 64 | Temp 97.7°F | Ht 65.0 in | Wt 151.0 lb

## 2019-10-16 DIAGNOSIS — Z3041 Encounter for surveillance of contraceptive pills: Secondary | ICD-10-CM

## 2019-10-16 DIAGNOSIS — Z01419 Encounter for gynecological examination (general) (routine) without abnormal findings: Secondary | ICD-10-CM

## 2019-10-16 MED ORDER — LEVONORGEST-ETH ESTRAD 91-DAY 0.15-0.03 &0.01 MG PO TABS: 1.0000 | ORAL_TABLET | Freq: Every day | ORAL | 3 refills | Status: DC

## 2019-10-16 NOTE — Patient Instructions (Signed)
EXERCISE AND DIET:  We recommended that you start or continue a regular exercise program for good health. Regular exercise means any activity that makes your heart beat faster and makes you sweat.  We recommend exercising at least 30 minutes per day at least 3 days a week, preferably 4 or 5.  We also recommend a diet low in fat and sugar.  Inactivity, poor dietary choices and obesity can cause diabetes, heart attack, stroke, and kidney damage, among others.    ALCOHOL AND SMOKING:  Women should limit their alcohol intake to no more than 7 drinks/beers/glasses of wine (combined, not each!) per week. Moderation of alcohol intake to this level decreases your risk of breast cancer and liver damage. And of course, no recreational drugs are part of a healthy lifestyle.  And absolutely no smoking or even second hand smoke. Most people know smoking can cause heart and lung diseases, but did you know it also contributes to weakening of your bones? Aging of your skin?  Yellowing of your teeth and nails?  CALCIUM AND VITAMIN D:  Adequate intake of calcium and Vitamin D are recommended.  The recommendations for exact amounts of these supplements seem to change often, but generally speaking 1,000 mg of calcium (between diet and supplement) and 800 units of Vitamin D per day seems prudent. Certain women may benefit from higher intake of Vitamin D.  If you are among these women, your doctor will have told you during your visit.    PAP SMEARS:  Pap smears, to check for cervical cancer or precancers,  have traditionally been done yearly, although recent scientific advances have shown that most women can have pap smears less often.  However, every woman still should have a physical exam from her gynecologist every year. It will include a breast check, inspection of the vulva and vagina to check for abnormal growths or skin changes, a visual exam of the cervix, and then an exam to evaluate the size and shape of the uterus and  ovaries.  And after 42 years of age, a rectal exam is indicated to check for rectal cancers. We will also provide age appropriate advice regarding health maintenance, like when you should have certain vaccines, screening for sexually transmitted diseases, bone density testing, colonoscopy, mammograms, etc.   MAMMOGRAMS:  All women over 42 years old should have a yearly mammogram. Many facilities now offer a "3D" mammogram, which may cost around $50 extra out of pocket. If possible,  we recommend you accept the option to have the 3D mammogram performed.  It both reduces the number of women who will be called back for extra views which then turn out to be normal, and it is better than the routine mammogram at detecting truly abnormal areas.    COLON CANCER SCREENING: Now recommend starting at age 45. At this time colonoscopy is not covered for routine screening until 50. There are take home tests that can be done between 45-49.   COLONOSCOPY:  Colonoscopy to screen for colon cancer is recommended for all women at age 50.  We know, you hate the idea of the prep.  We agree, BUT, having colon cancer and not knowing it is worse!!  Colon cancer so often starts as a polyp that can be seen and removed at colonscopy, which can quite literally save your life!  And if your first colonoscopy is normal and you have no family history of colon cancer, most women don't have to have it again for   10 years.  Once every ten years, you can do something that may end up saving your life, right?  We will be happy to help you get it scheduled when you are ready.  Be sure to check your insurance coverage so you understand how much it will cost.  It may be covered as a preventative service at no cost, but you should check your particular policy.      Breast Self-Awareness Breast self-awareness means being familiar with how your breasts look and feel. It involves checking your breasts regularly and reporting any changes to your  health care provider. Practicing breast self-awareness is important. A change in your breasts can be a sign of a serious medical problem. Being familiar with how your breasts look and feel allows you to find any problems early, when treatment is more likely to be successful. All women should practice breast self-awareness, including women who have had breast implants. How to do a breast self-exam One way to learn what is normal for your breasts and whether your breasts are changing is to do a breast self-exam. To do a breast self-exam: Look for Changes  1. Remove all the clothing above your waist. 2. Stand in front of a mirror in a room with good lighting. 3. Put your hands on your hips. 4. Push your hands firmly downward. 5. Compare your breasts in the mirror. Look for differences between them (asymmetry), such as: ? Differences in shape. ? Differences in size. ? Puckers, dips, and bumps in one breast and not the other. 6. Look at each breast for changes in your skin, such as: ? Redness. ? Scaly areas. 7. Look for changes in your nipples, such as: ? Discharge. ? Bleeding. ? Dimpling. ? Redness. ? A change in position. Feel for Changes Carefully feel your breasts for lumps and changes. It is best to do this while lying on your back on the floor and again while sitting or standing in the shower or tub with soapy water on your skin. Feel each breast in the following way:  Place the arm on the side of the breast you are examining above your head.  Feel your breast with the other hand.  Start in the nipple area and make  inch (2 cm) overlapping circles to feel your breast. Use the pads of your three middle fingers to do this. Apply light pressure, then medium pressure, then firm pressure. The light pressure will allow you to feel the tissue closest to the skin. The medium pressure will allow you to feel the tissue that is a little deeper. The firm pressure will allow you to feel the tissue  close to the ribs.  Continue the overlapping circles, moving downward over the breast until you feel your ribs below your breast.  Move one finger-width toward the center of the body. Continue to use the  inch (2 cm) overlapping circles to feel your breast as you move slowly up toward your collarbone.  Continue the up and down exam using all three pressures until you reach your armpit.  Write Down What You Find  Write down what is normal for each breast and any changes that you find. Keep a written record with breast changes or normal findings for each breast. By writing this information down, you do not need to depend only on memory for size, tenderness, or location. Write down where you are in your menstrual cycle, if you are still menstruating. If you are having trouble noticing differences   in your breasts, do not get discouraged. With time you will become more familiar with the variations in your breasts and more comfortable with the exam. How often should I examine my breasts? Examine your breasts every month. If you are breastfeeding, the best time to examine your breasts is after a feeding or after using a breast pump. If you menstruate, the best time to examine your breasts is 5-7 days after your period is over. During your period, your breasts are lumpier, and it may be more difficult to notice changes. When should I see my health care provider? See your health care provider if you notice:  A change in shape or size of your breasts or nipples.  A change in the skin of your breast or nipples, such as a reddened or scaly area.  Unusual discharge from your nipples.  A lump or thick area that was not there before.  Pain in your breasts.  Anything that concerns you.  

## 2019-10-20 IMAGING — MG DIGITAL SCREENING BILATERAL MAMMOGRAM WITH CAD
5 series · 5 of 5 positions shown · non-contrast
Comparison: Previous exam(s).

CLINICAL DATA: Screening.

EXAM:
DIGITAL SCREENING BILATERAL MAMMOGRAM WITH CAD

[L MLO (1 of 2)]
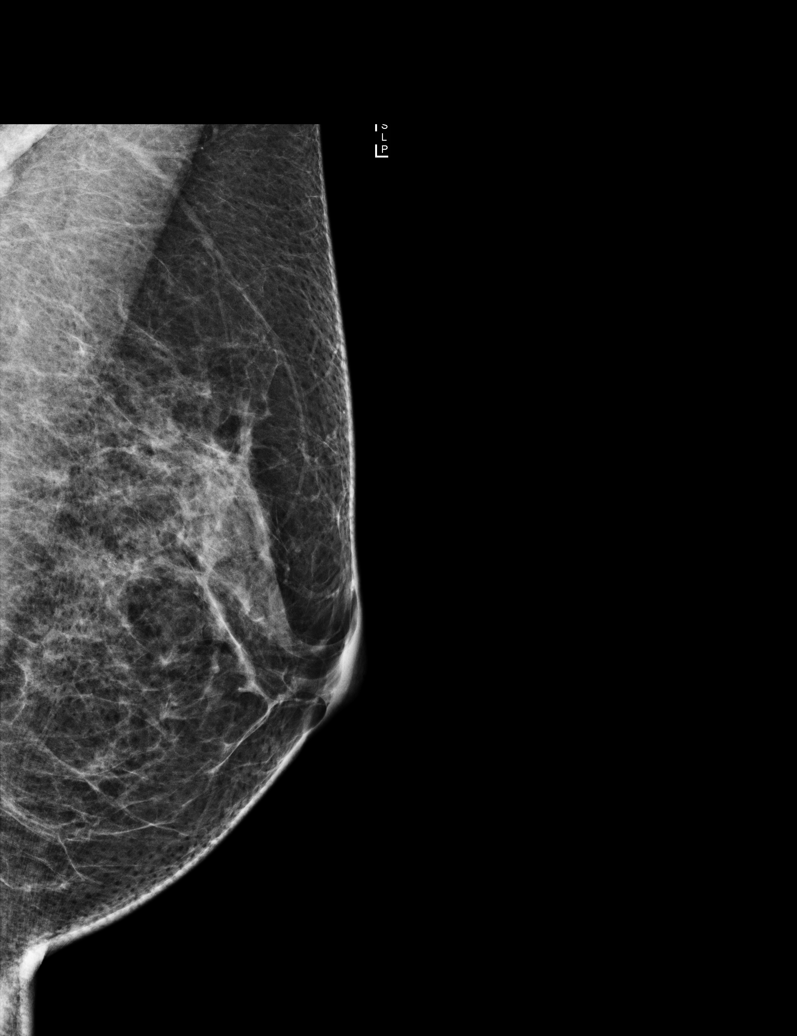

[L CC]
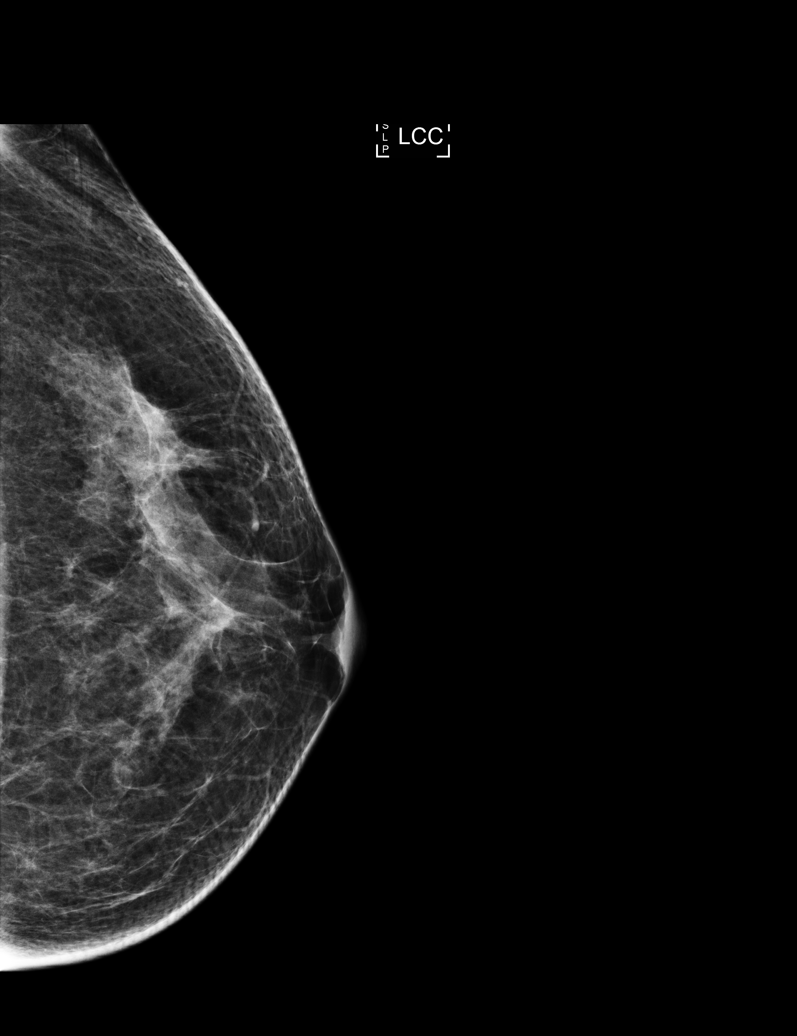

[L MLO (2 of 2)]
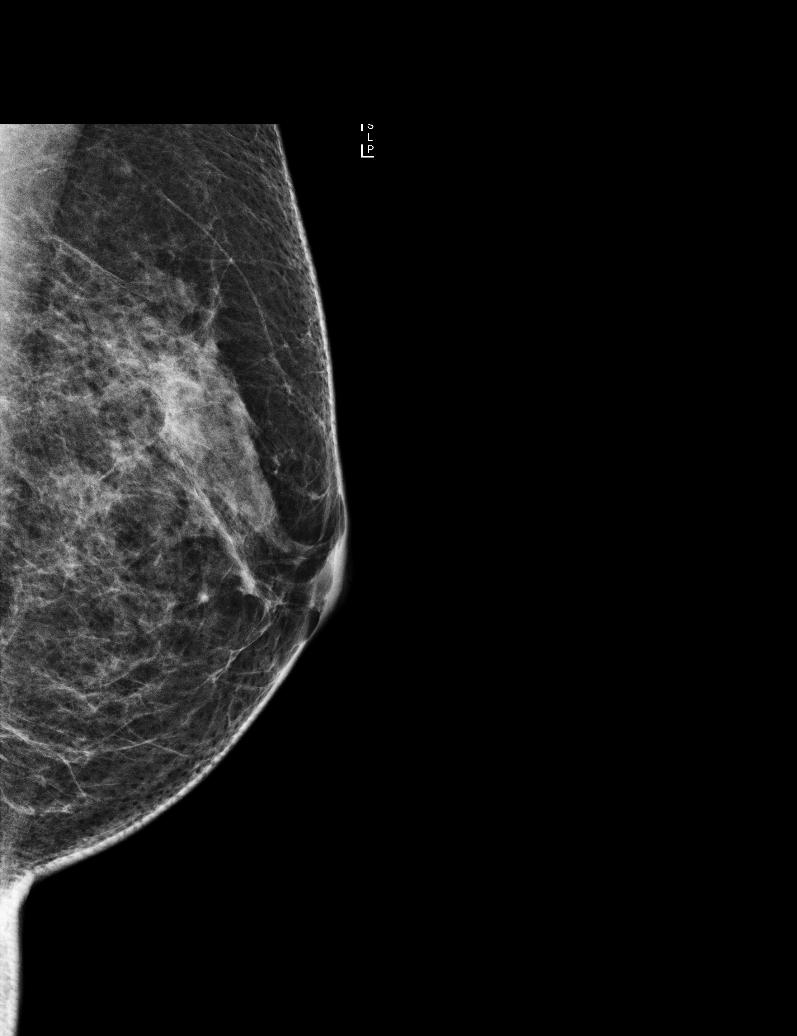

[R MLO]
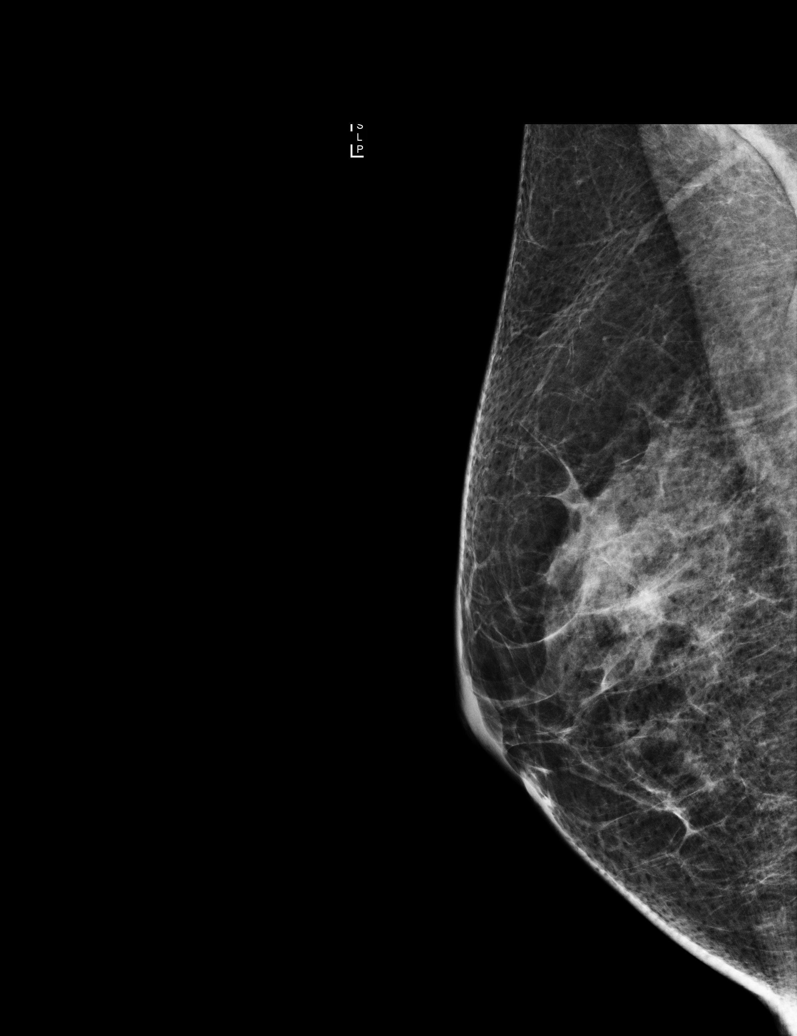

[R CC]
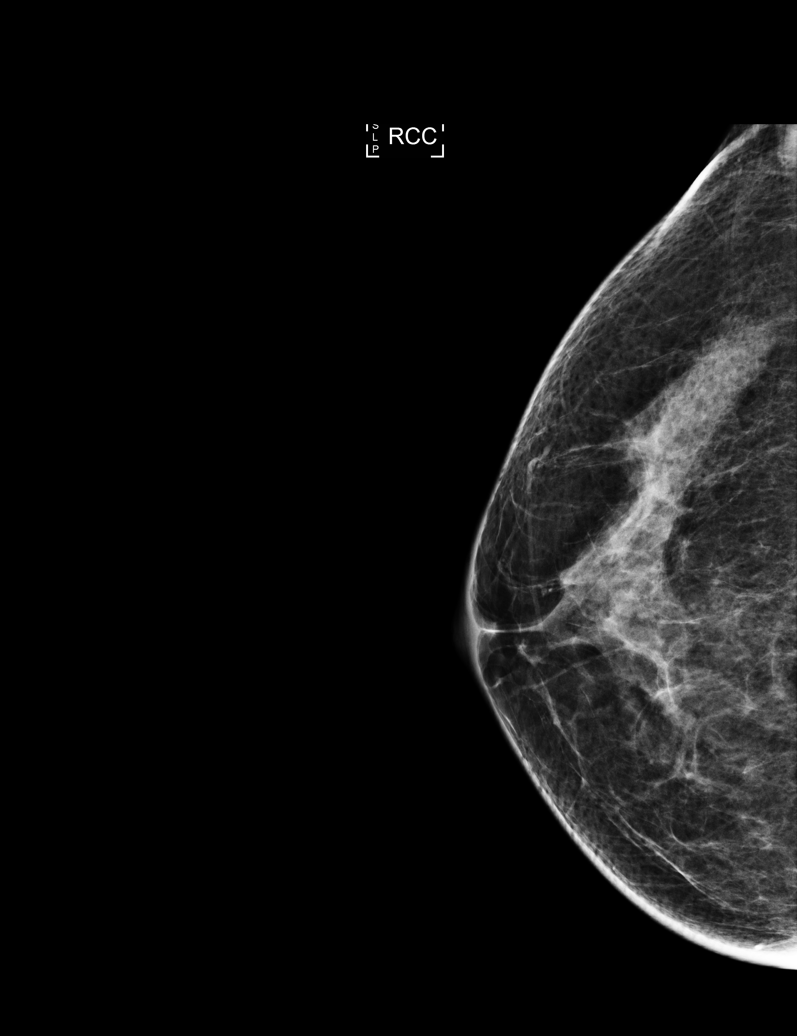

[5 of 5 positions shown; findings below may reference images not displayed]

ACR Breast Density Category c: The breast tissue is heterogeneously
dense, which may obscure small masses.
FINDINGS: There are no findings suspicious for malignancy. Images were
processed with CAD.
IMPRESSION: No mammographic evidence of malignancy. A result letter of this
screening mammogram will be mailed directly to the patient.

RECOMMENDATION:
Screening mammogram in one year. (Code:YJ-2-FEZ)

BI-RADS CATEGORY  1: Negative.

## 2019-12-20 DIAGNOSIS — D225 Melanocytic nevi of trunk: Secondary | ICD-10-CM | POA: Diagnosis not present

## 2019-12-20 DIAGNOSIS — Z1283 Encounter for screening for malignant neoplasm of skin: Secondary | ICD-10-CM | POA: Diagnosis not present

## 2019-12-20 DIAGNOSIS — D485 Neoplasm of uncertain behavior of skin: Secondary | ICD-10-CM | POA: Diagnosis not present

## 2019-12-26 ENCOUNTER — Other Ambulatory Visit: Payer: Self-pay | Admitting: Family Medicine

## 2019-12-26 DIAGNOSIS — Z1231 Encounter for screening mammogram for malignant neoplasm of breast: Secondary | ICD-10-CM

## 2019-12-31 ENCOUNTER — Ambulatory Visit
Admission: RE | Admit: 2019-12-31 | Discharge: 2019-12-31 | Disposition: A | Discharge status: Home / Self Care | Payer: BC Managed Care – PPO | Source: Ambulatory Visit | Attending: Family Medicine | Admitting: Family Medicine

## 2019-12-31 ENCOUNTER — Other Ambulatory Visit: Payer: Self-pay

## 2019-12-31 DIAGNOSIS — Z1231 Encounter for screening mammogram for malignant neoplasm of breast: Secondary | ICD-10-CM

## 2020-01-01 ENCOUNTER — Other Ambulatory Visit: Payer: Self-pay | Admitting: Family Medicine

## 2020-01-01 DIAGNOSIS — R928 Other abnormal and inconclusive findings on diagnostic imaging of breast: Secondary | ICD-10-CM

## 2020-01-06 ENCOUNTER — Ambulatory Visit: Payer: BC Managed Care – PPO

## 2020-01-06 ENCOUNTER — Ambulatory Visit
Admission: RE | Admit: 2020-01-06 | Discharge: 2020-01-06 | Disposition: A | Discharge status: Home / Self Care | Payer: BC Managed Care – PPO | Source: Ambulatory Visit | Attending: Family Medicine | Admitting: Family Medicine

## 2020-01-06 ENCOUNTER — Other Ambulatory Visit: Payer: Self-pay

## 2020-01-06 DIAGNOSIS — R928 Other abnormal and inconclusive findings on diagnostic imaging of breast: Secondary | ICD-10-CM | POA: Diagnosis not present

## 2020-02-25 DIAGNOSIS — D225 Melanocytic nevi of trunk: Secondary | ICD-10-CM | POA: Diagnosis not present

## 2020-04-20 DIAGNOSIS — Z20822 Contact with and (suspected) exposure to covid-19: Secondary | ICD-10-CM | POA: Diagnosis not present

## 2020-07-30 DIAGNOSIS — M25562 Pain in left knee: Secondary | ICD-10-CM | POA: Diagnosis not present

## 2020-07-30 DIAGNOSIS — M25561 Pain in right knee: Secondary | ICD-10-CM | POA: Diagnosis not present

## 2020-08-25 DIAGNOSIS — M25562 Pain in left knee: Secondary | ICD-10-CM | POA: Diagnosis not present

## 2020-08-25 DIAGNOSIS — M25561 Pain in right knee: Secondary | ICD-10-CM | POA: Diagnosis not present

## 2020-08-25 DIAGNOSIS — M2550 Pain in unspecified joint: Secondary | ICD-10-CM | POA: Diagnosis not present

## 2020-10-13 DIAGNOSIS — Z1152 Encounter for screening for COVID-19: Secondary | ICD-10-CM | POA: Diagnosis not present

## 2020-10-19 ENCOUNTER — Ambulatory Visit: Payer: BC Managed Care – PPO | Admitting: Obstetrics and Gynecology

## 2020-11-27 ENCOUNTER — Ambulatory Visit (INDEPENDENT_AMBULATORY_CARE_PROVIDER_SITE_OTHER): Payer: BC Managed Care – PPO | Admitting: Obstetrics and Gynecology

## 2020-11-27 ENCOUNTER — Encounter: Payer: Self-pay | Admitting: Obstetrics and Gynecology

## 2020-11-27 ENCOUNTER — Other Ambulatory Visit: Payer: Self-pay

## 2020-11-27 VITALS — BP 112/60 | HR 99 | Resp 98 | Ht 64.5 in | Wt 153.0 lb

## 2020-11-27 DIAGNOSIS — Z833 Family history of diabetes mellitus: Secondary | ICD-10-CM

## 2020-11-27 DIAGNOSIS — Z01419 Encounter for gynecological examination (general) (routine) without abnormal findings: Secondary | ICD-10-CM | POA: Diagnosis not present

## 2020-11-27 DIAGNOSIS — E785 Hyperlipidemia, unspecified: Secondary | ICD-10-CM | POA: Diagnosis not present

## 2020-11-27 DIAGNOSIS — Z3041 Encounter for surveillance of contraceptive pills: Secondary | ICD-10-CM | POA: Diagnosis not present

## 2020-11-27 DIAGNOSIS — Z0000 Encounter for general adult medical examination without abnormal findings: Secondary | ICD-10-CM

## 2020-11-27 DIAGNOSIS — Z83438 Family history of other disorder of lipoprotein metabolism and other lipidemia: Secondary | ICD-10-CM | POA: Diagnosis not present

## 2020-11-27 MED ORDER — LEVONORGEST-ETH ESTRAD 91-DAY 0.15-0.03 &0.01 MG PO TABS: 1.0000 | ORAL_TABLET | Freq: Every day | ORAL | 3 refills | Status: DC

## 2020-11-27 NOTE — Progress Notes (Signed)
45 y.o. G6P2002 Married White or Caucasian Not Hispanic or Latino female here for annual exam. Per patient, having some break through bleeding on OCP.  She is on seasonique. She is cycling every 3 months, bleeds for a total of 4-6 days. She uses a menstrual cup, changes it 2 x a day only 1/2 full. For the last 3 cycles she has been spotting off and on for the 2 weeks prior to her cycle.   Sexually active, no pain.     H/o mixed urinary incontinence that improved with PT. Still some symptoms, tolerable. The urge is worse than the stress incontinence. Declines further treatment for now.   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 She went on to have 2 FTNSVD's  Patient's last menstrual period was 08/31/2020.          Sexually active: Yes.    The current method of family planning is Seasonique.    Exercising: Yes.    walking and line dancing Smoker:  no  Health Maintenance: Pap:  10/09/18 Neg:Neg HR HPV History of abnormal Pap:  Years ago per patient, repeat was normal.  MMG:  01/06/20 Right Diagnostic MM - BIRADS 1 negative/density b TDaP:  10/09/2018 Gardasil: never   reports that she has never smoked. She has never used smokeless tobacco. She reports current alcohol use of about 1.0 - 2.0 standard drink of alcohol per week. She reports that she does not use drugs. 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 12 year old son and a49 year old daughter.  Past Medical History:  Diagnosis Date  . Dysmenorrhea   . Endometriosis   . Infertility, female   . Urinary incontinence     Past Surgical History:  Procedure Laterality Date  . GANGLION CYST EXCISION Bilateral   . HYSTEROSCOPY    . OTHER SURGICAL HISTORY     Repair of bicornuate uterus  . REFRACTIVE SURGERY  2001    Current Outpatient Medications  Medication Sig Dispense Refill  . CANNABIDIOL PO Take by mouth.    . Fexofenadine HCl (ALLEGRA PO) Take 1 tablet by mouth  daily.    . Levonorgestrel-Ethinyl Estradiol (SEASONIQUE) 0.15-0.03 &0.01 MG tablet Take 1 tablet by mouth daily. 1 Package 3  . Multiple Vitamin (MULTIVITAMIN) tablet Take 1 tablet by mouth daily.    . naproxen sodium (ALEVE) 220 MG tablet Take 220 mg by mouth.    . Omega-3 Fatty Acids (FISH OIL PO) Take 1 capsule by mouth daily.     No current facility-administered medications for this visit.    Family History  Problem Relation Age of Onset  . Diabetes Father 39  . High Cholesterol Father   . Hypertension Father   . Alcohol abuse Maternal Grandfather   . Diabetes Brother 20  . Hyperlipidemia Brother   . Obesity Brother   . Diabetes Brother 20  . Hyperlipidemia Brother   . Obesity Brother   . Breast cancer Paternal Grandmother     Review of Systems  Constitutional: Negative.   HENT: Negative.   Eyes: Negative.   Respiratory: Negative.   Cardiovascular: Negative.   Gastrointestinal: Negative.   Endocrine: Negative.   Genitourinary: Negative.   Musculoskeletal: Negative.   Skin: Negative.   Allergic/Immunologic: Negative.   Neurological: Negative.   Hematological: Negative.   Psychiatric/Behavioral: Negative.     Exam:   BP 112/60   Pulse 99   Resp (!) 98  Ht 5' 4.5" (1.638 m)   Wt 153 lb (69.4 kg)   LMP 08/31/2020   BMI 25.86 kg/m   Weight change: @WEIGHTCHANGE @ Height:   Height: 5' 4.5" (163.8 cm)  Ht Readings from Last 3 Encounters:  11/27/20 5' 4.5" (1.638 m)  10/16/19 5\' 5"  (1.651 m)  05/09/19 5\' 5"  (1.651 m)    General appearance: alert, cooperative and appears stated age Head: Normocephalic, without obvious abnormality, atraumatic Neck: no adenopathy, supple, symmetrical, trachea midline and thyroid normal to inspection and palpation Lungs: clear to auscultation bilaterally Cardiovascular: regular rate and rhythm Breasts: normal appearance, no masses or tenderness Abdomen: soft, non-tender; non distended,  no masses,  no organomegaly Extremities:  extremities normal, atraumatic, no cyanosis or edema Skin: Skin color, texture, turgor normal. No rashes or lesions Lymph nodes: Cervical, supraclavicular, and axillary nodes normal. No abnormal inguinal nodes palpated Neurologic: Grossly normal   Pelvic: External genitalia:  no lesions              Urethra:  normal appearing urethra with no masses, tenderness or lesions              Bartholins and Skenes: normal                 Vagina: normal appearing vagina with normal color and discharge, no lesions              Cervix: no lesions               Bimanual Exam:  Uterus:  normal size, contour, position, consistency, mobility, non-tender              Adnexa: no mass, fullness, tenderness               Rectovaginal: Confirms               Anus:  normal sphincter tone, no lesions  chaperoned for the exam.  1. Well woman exam Discussed breast self exam Discussed calcium and vit D intake No pap this year She will schedule her mammogram  2. Encounter for surveillance of contraceptive pills Some BTB, - Levonorgestrel-Ethinyl Estradiol (SEASONIQUE) 0.15-0.03 &0.01 MG tablet; Take 1 tablet by mouth daily.  Dispense: 91 tablet; Refill: 3  3. Laboratory exam ordered as part of routine general medical examination - CBC - Comprehensive metabolic panel - Lipid panel  4. Family history of hyperlipidemia-  in 20's -30's  - father and brother - Lipid panel  5. Family history of diabetes mellitus (DM) - Hemoglobin A1c

## 2020-11-27 NOTE — Patient Instructions (Signed)

## 2020-11-28 LAB — COMPREHENSIVE METABOLIC PANEL
AG Ratio: 1.4 (calc) (ref 1.0–2.5)
ALT: 11 U/L (ref 6–29)
AST: 15 U/L (ref 10–30)
Albumin: 3.9 g/dL (ref 3.6–5.1)
Alkaline phosphatase (APISO): 45 U/L (ref 31–125)
BUN: 13 mg/dL (ref 7–25)
CO2: 27 mmol/L (ref 20–32)
Calcium: 9.5 mg/dL (ref 8.6–10.2)
Chloride: 104 mmol/L (ref 98–110)
Creat: 0.7 mg/dL (ref 0.50–1.10)
Globulin: 2.8 g/dL (calc) (ref 1.9–3.7)
Glucose, Bld: 86 mg/dL (ref 65–99)
Potassium: 4.1 mmol/L (ref 3.5–5.3)
Sodium: 138 mmol/L (ref 135–146)
Total Bilirubin: 0.8 mg/dL (ref 0.2–1.2)
Total Protein: 6.7 g/dL (ref 6.1–8.1)

## 2020-11-28 LAB — HEMOGLOBIN A1C
Hgb A1c MFr Bld: 4.9 % of total Hgb (ref <5.7)
Mean Plasma Glucose: 94 mg/dL
eAG (mmol/L): 5.2 mmol/L

## 2020-11-28 LAB — CBC
HCT: 39.9 % (ref 35.0–45.0)
Hemoglobin: 13.6 g/dL (ref 11.7–15.5)
MCH: 31.9 pg (ref 27.0–33.0)
MCHC: 34.1 g/dL (ref 32.0–36.0)
MCV: 93.7 fL (ref 80.0–100.0)
MPV: 11.9 fL (ref 7.5–12.5)
Platelets: 289 10*3/uL (ref 140–400)
RBC: 4.26 10*6/uL (ref 3.80–5.10)
RDW: 11.4 % (ref 11.0–15.0)
WBC: 5.9 10*3/uL (ref 3.8–10.8)

## 2020-11-28 LAB — LIPID PANEL
Cholesterol: 196 mg/dL (ref <200)
HDL: 56 mg/dL (ref >=50)
LDL Cholesterol (Calc): 115 mg/dL (calc) — ABNORMAL HIGH
Non-HDL Cholesterol (Calc): 140 mg/dL (calc) — ABNORMAL HIGH (ref <130)
Total CHOL/HDL Ratio: 3.5 (calc) (ref <5.0)
Triglycerides: 140 mg/dL (ref <150)

## 2020-12-03 DIAGNOSIS — M25561 Pain in right knee: Secondary | ICD-10-CM | POA: Diagnosis not present

## 2020-12-03 DIAGNOSIS — M25562 Pain in left knee: Secondary | ICD-10-CM | POA: Diagnosis not present

## 2020-12-05 IMAGING — MG MM DIGITAL DIAGNOSTIC UNILAT*R* W/ TOMO W/ CAD
8 series · 9 of 24 positions shown · non-contrast
Comparison: Previous exam(s).

CLINICAL DATA: 43-year-old female for further evaluation of
possible RIGHT breast asymmetry on screening mammogram.

EXAM:
DIGITAL DIAGNOSTIC UNILATERAL RIGHT MAMMOGRAM WITH CAD AND TOMO

[R CC synth-2D (1 of 2)]
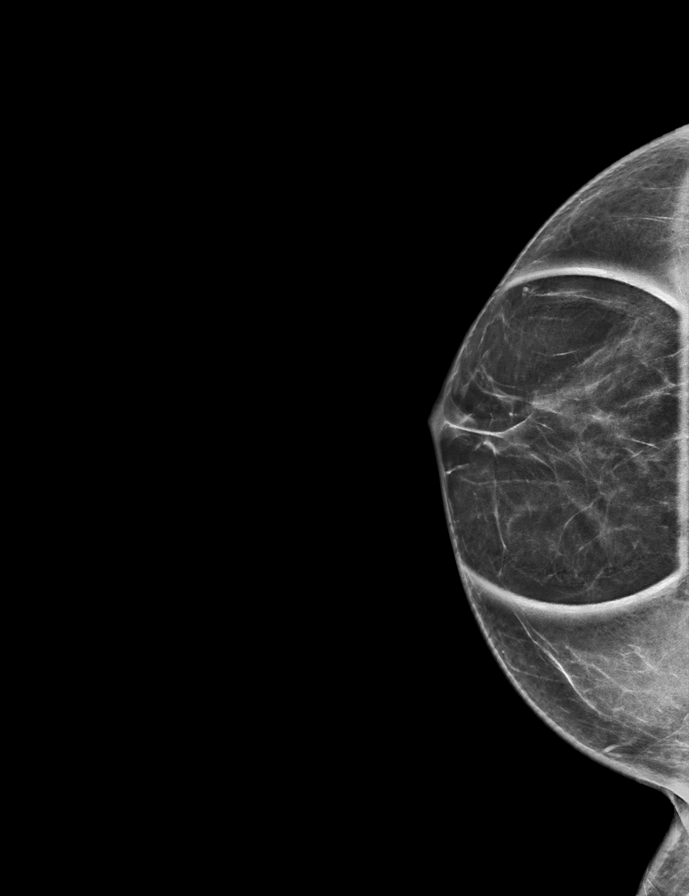

[R CC synth-2D (2 of 2)]
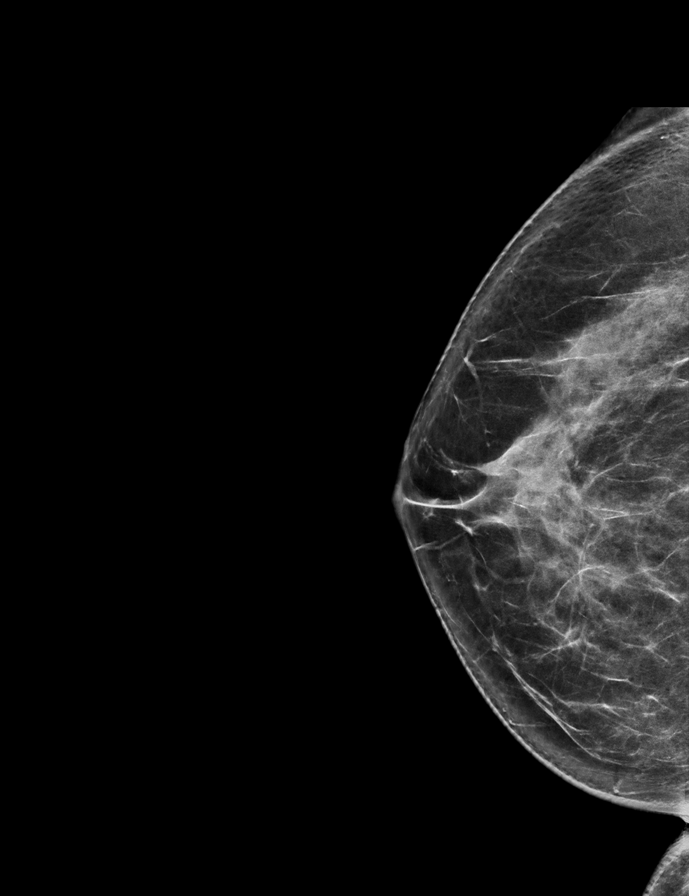

[R MLO synth-2D (1 of 2)]
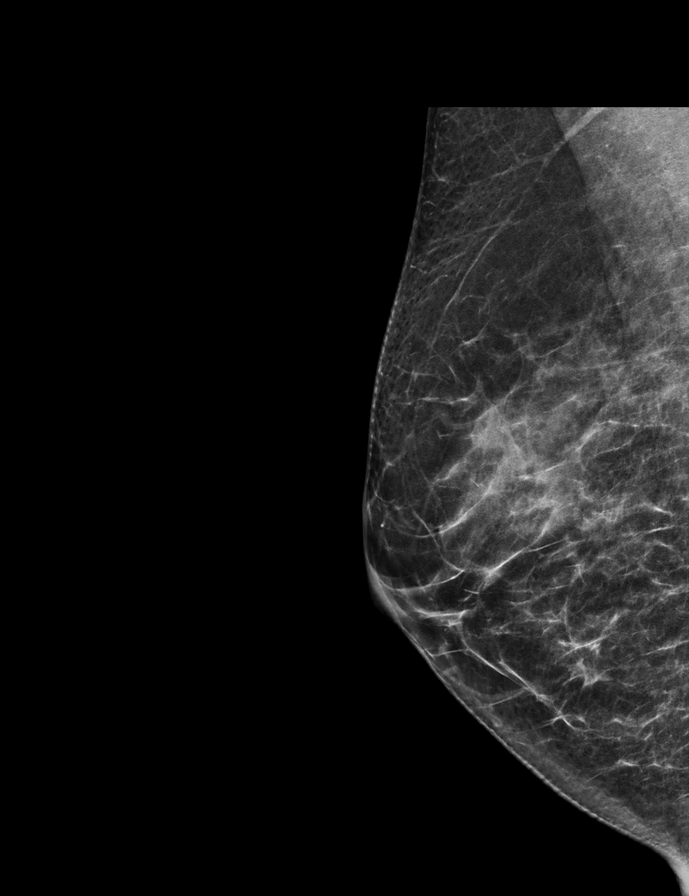

[R MLO synth-2D (2 of 2)]
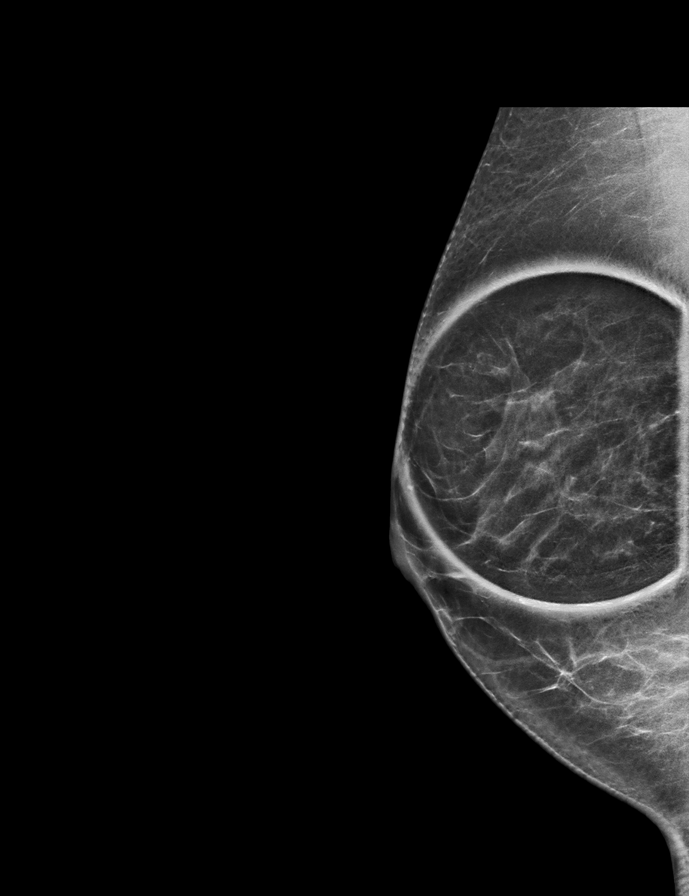

[R CC tomo · 2 of 58 frames shown (1 of 2)]
[frame 19/58]
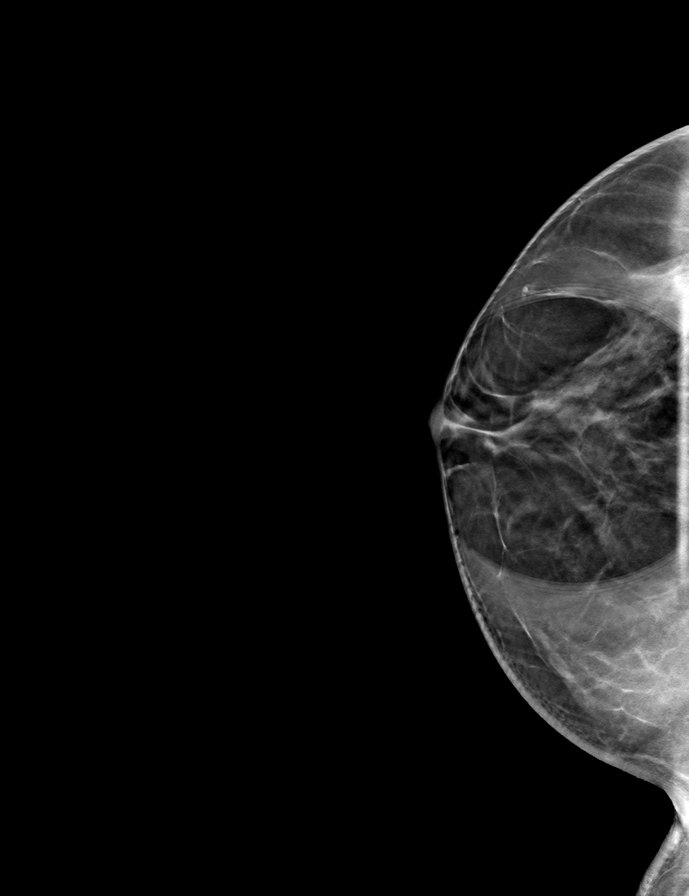
[frame 29/58]
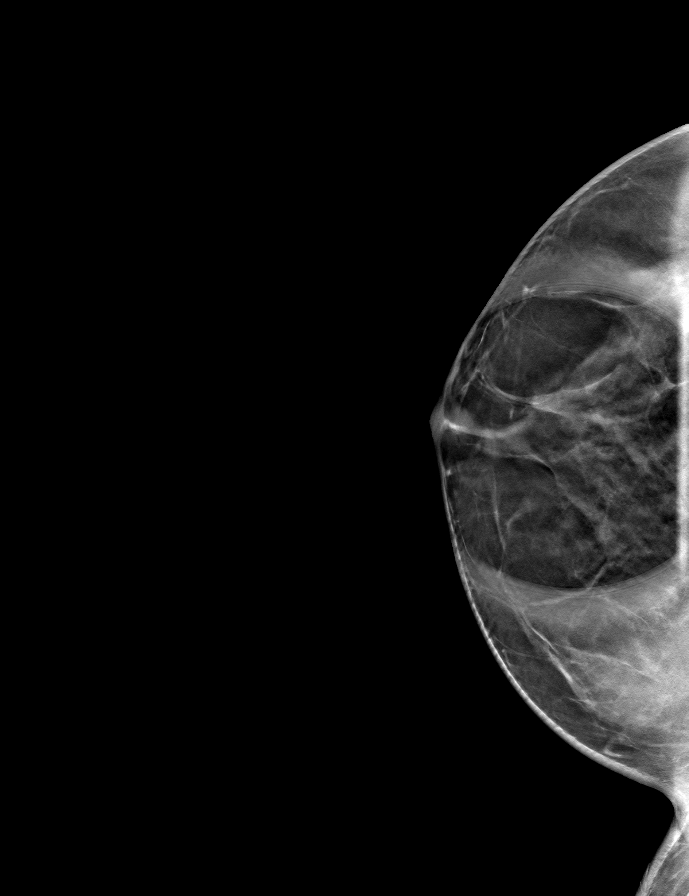

[R MLO tomo (1 of 2) · tomo slice 31/60.0]
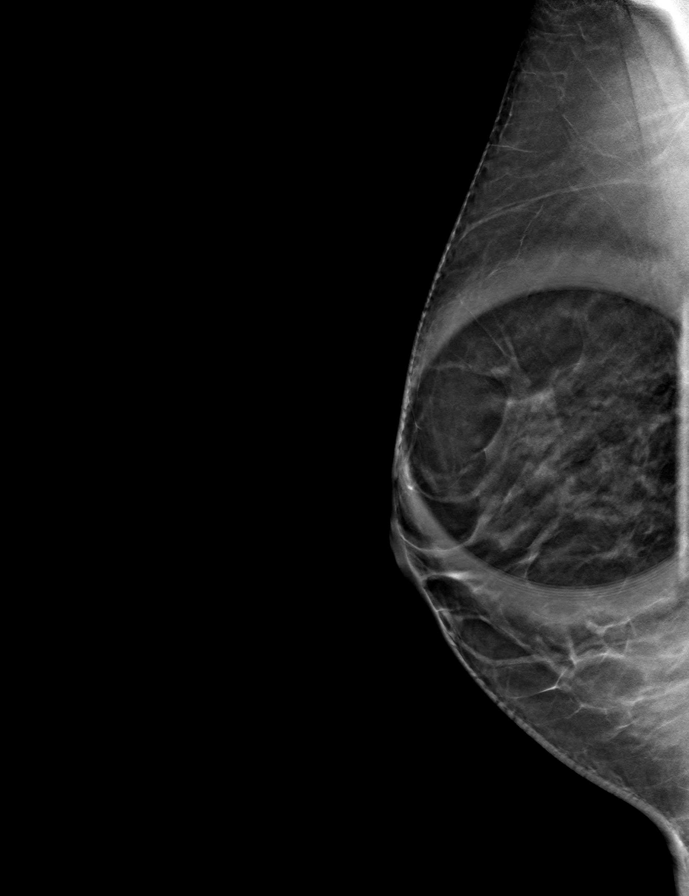

[R CC tomo (2 of 2) · tomo slice 37/72.0]
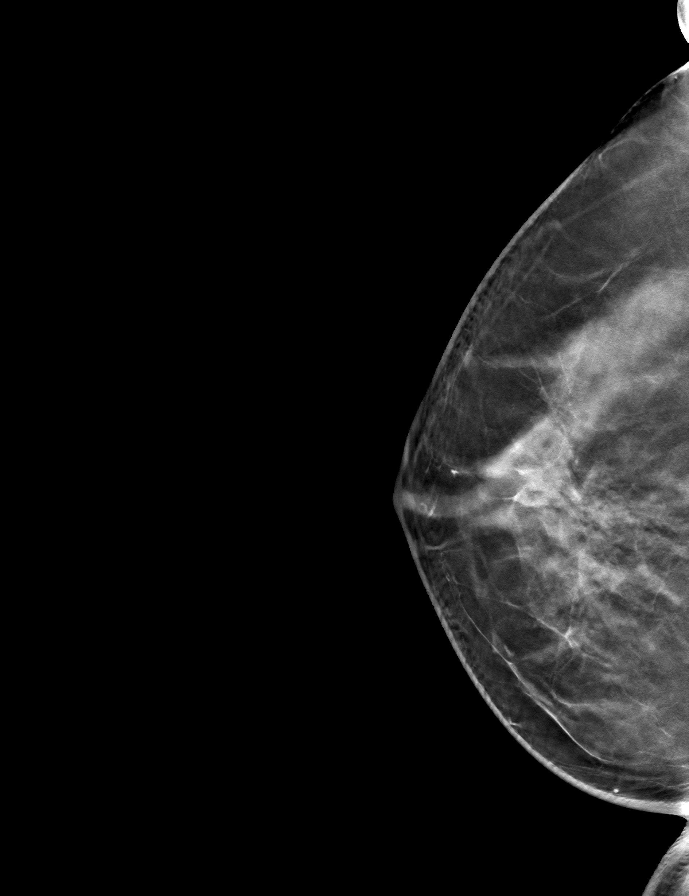

[R MLO tomo (2 of 2) · tomo slice 32/63.0]
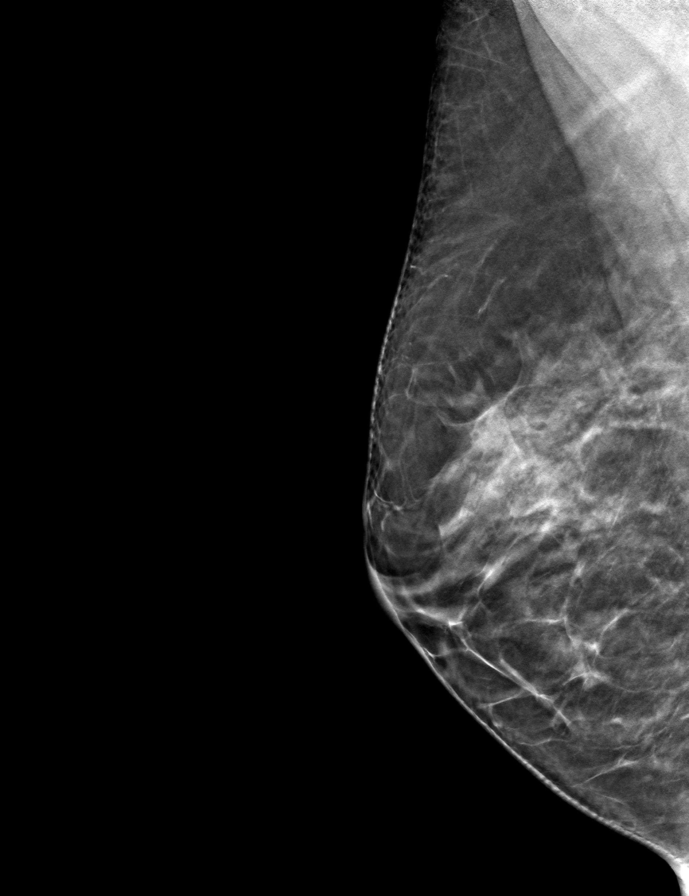

[9 of 24 positions shown; findings below may reference images not displayed]

ACR Breast Density Category b: There are scattered areas of
fibroglandular density.
FINDINGS: 2D/3D full field views of both breasts and spot compression view of
the RIGHT breast demonstrate no suspicious mass, distortion or
worrisome calcifications. The asymmetry disperses on spot
compression views.

Mammographic images were processed with CAD.
IMPRESSION: No persistent abnormality at the site of the screening study
finding.

RECOMMENDATION:
Bilateral screening mammogram in 1 year.

I have discussed the findings and recommendations with the patient.
If applicable, a reminder letter will be sent to the patient
regarding the next appointment.

BI-RADS CATEGORY  1: Negative.

## 2020-12-28 ENCOUNTER — Other Ambulatory Visit: Payer: Self-pay | Admitting: Physician Assistant

## 2020-12-28 DIAGNOSIS — Z1231 Encounter for screening mammogram for malignant neoplasm of breast: Secondary | ICD-10-CM

## 2021-02-05 DIAGNOSIS — H66001 Acute suppurative otitis media without spontaneous rupture of ear drum, right ear: Secondary | ICD-10-CM | POA: Diagnosis not present

## 2021-02-05 DIAGNOSIS — H0011 Chalazion right upper eyelid: Secondary | ICD-10-CM | POA: Diagnosis not present

## 2021-02-10 ENCOUNTER — Ambulatory Visit
Admission: RE | Admit: 2021-02-10 | Discharge: 2021-02-10 | Disposition: A | Discharge status: Home / Self Care | Payer: BC Managed Care – PPO | Source: Ambulatory Visit

## 2021-02-10 ENCOUNTER — Other Ambulatory Visit: Payer: Self-pay

## 2021-02-10 DIAGNOSIS — Z1231 Encounter for screening mammogram for malignant neoplasm of breast: Secondary | ICD-10-CM | POA: Diagnosis not present

## 2021-03-09 DIAGNOSIS — M79671 Pain in right foot: Secondary | ICD-10-CM | POA: Diagnosis not present

## 2021-03-09 DIAGNOSIS — M25571 Pain in right ankle and joints of right foot: Secondary | ICD-10-CM | POA: Diagnosis not present

## 2021-03-30 DIAGNOSIS — M25571 Pain in right ankle and joints of right foot: Secondary | ICD-10-CM | POA: Diagnosis not present

## 2021-04-12 DIAGNOSIS — M21619 Bunion of unspecified foot: Secondary | ICD-10-CM | POA: Insufficient documentation

## 2021-04-12 DIAGNOSIS — M21611 Bunion of right foot: Secondary | ICD-10-CM | POA: Diagnosis not present

## 2021-04-27 DIAGNOSIS — M21611 Bunion of right foot: Secondary | ICD-10-CM | POA: Diagnosis not present

## 2021-05-03 HISTORY — PX: BUNIONECTOMY: SHX129

## 2021-05-25 DIAGNOSIS — J029 Acute pharyngitis, unspecified: Secondary | ICD-10-CM | POA: Diagnosis not present

## 2021-05-25 DIAGNOSIS — U071 COVID-19: Secondary | ICD-10-CM | POA: Diagnosis not present

## 2021-06-09 DIAGNOSIS — M21611 Bunion of right foot: Secondary | ICD-10-CM | POA: Diagnosis not present

## 2021-07-06 DIAGNOSIS — M25562 Pain in left knee: Secondary | ICD-10-CM | POA: Diagnosis not present

## 2021-07-06 DIAGNOSIS — M25561 Pain in right knee: Secondary | ICD-10-CM | POA: Diagnosis not present

## 2021-07-12 DIAGNOSIS — M21611 Bunion of right foot: Secondary | ICD-10-CM | POA: Diagnosis not present

## 2021-07-20 DIAGNOSIS — M25562 Pain in left knee: Secondary | ICD-10-CM | POA: Diagnosis not present

## 2021-07-20 DIAGNOSIS — M25561 Pain in right knee: Secondary | ICD-10-CM | POA: Diagnosis not present

## 2021-07-21 DIAGNOSIS — M25674 Stiffness of right foot, not elsewhere classified: Secondary | ICD-10-CM | POA: Diagnosis not present

## 2021-07-22 DIAGNOSIS — M25561 Pain in right knee: Secondary | ICD-10-CM | POA: Insufficient documentation

## 2021-07-22 DIAGNOSIS — M25562 Pain in left knee: Secondary | ICD-10-CM | POA: Insufficient documentation

## 2021-07-26 DIAGNOSIS — M25562 Pain in left knee: Secondary | ICD-10-CM | POA: Diagnosis not present

## 2021-07-26 DIAGNOSIS — M2560 Stiffness of unspecified joint, not elsewhere classified: Secondary | ICD-10-CM | POA: Insufficient documentation

## 2021-07-26 DIAGNOSIS — M25561 Pain in right knee: Secondary | ICD-10-CM | POA: Diagnosis not present

## 2021-08-04 DIAGNOSIS — M25561 Pain in right knee: Secondary | ICD-10-CM | POA: Diagnosis not present

## 2021-08-04 DIAGNOSIS — M25562 Pain in left knee: Secondary | ICD-10-CM | POA: Diagnosis not present

## 2021-08-30 DIAGNOSIS — T8484XA Pain due to internal orthopedic prosthetic devices, implants and grafts, initial encounter: Secondary | ICD-10-CM | POA: Diagnosis not present

## 2021-08-30 DIAGNOSIS — T849XXA Unspecified complication of internal orthopedic prosthetic device, implant and graft, initial encounter: Secondary | ICD-10-CM | POA: Insufficient documentation

## 2021-08-30 DIAGNOSIS — M21611 Bunion of right foot: Secondary | ICD-10-CM | POA: Diagnosis not present

## 2021-09-28 DIAGNOSIS — T8484XA Pain due to internal orthopedic prosthetic devices, implants and grafts, initial encounter: Secondary | ICD-10-CM | POA: Diagnosis not present

## 2021-11-17 ENCOUNTER — Other Ambulatory Visit: Payer: Self-pay | Admitting: Obstetrics and Gynecology

## 2021-11-17 DIAGNOSIS — Z3041 Encounter for surveillance of contraceptive pills: Secondary | ICD-10-CM

## 2021-11-17 NOTE — Telephone Encounter (Signed)
Last AEX 11/07/20 AEX scheduled 12/20/21.

## 2021-12-13 ENCOUNTER — Ambulatory Visit: Payer: BC Managed Care – PPO | Admitting: Obstetrics and Gynecology

## 2021-12-13 NOTE — Progress Notes (Unsigned)
46 y.o. G94P2002 Married White or Caucasian Not Hispanic or Latino female here for annual exam.      No LMP recorded. (Menstrual status: Oral contraceptives).          Sexually active: {yes no:314532}  The current method of family planning is {contraception:315051}.    Exercising: {yes no:314532}  {types:19826} Smoker:  {YES NO:22349}  Health Maintenance: Pap:  10/09/18 Neg:Neg HR HPV History of abnormal Pap:  Years ago per patient, repeat was normal.  MMG:  02/10/21 density C Bi-rads 1 neg  BMD:   none  Colonoscopy: none  TDaP:  10/09/18  Gardasil: none    reports that she has never smoked. She has never used smokeless tobacco. She reports current alcohol use of about 1.0 - 2.0 standard drink per week. She reports that she does not use drugs.  Past Medical History:  Diagnosis Date   Dysmenorrhea    Endometriosis    Infertility, female    Urinary incontinence     Past Surgical History:  Procedure Laterality Date   GANGLION CYST EXCISION Bilateral    HYSTEROSCOPY     OTHER SURGICAL HISTORY     Repair of bicornuate uterus   REFRACTIVE SURGERY  2001    Current Outpatient Medications  Medication Sig Dispense Refill   CANNABIDIOL PO Take by mouth.     Fexofenadine HCl (ALLEGRA PO) Take 1 tablet by mouth daily.     Levonorgestrel-Ethinyl Estradiol (AMETHIA) 0.15-0.03 &0.01 MG tablet TAKE 1 TABLET BY MOUTH EVERY DAY 91 tablet 0   Multiple Vitamin (MULTIVITAMIN) tablet Take 1 tablet by mouth daily.     naproxen sodium (ALEVE) 220 MG tablet Take 220 mg by mouth.     Omega-3 Fatty Acids (FISH OIL PO) Take 1 capsule by mouth daily.     No current facility-administered medications for this visit.    Family History  Problem Relation Age of Onset   Diabetes Father 79   High Cholesterol Father    Hypertension Father    Alcohol abuse Maternal Grandfather    Diabetes Brother 20   Hyperlipidemia Brother    Obesity Brother    Diabetes Brother 20   Hyperlipidemia Brother    Obesity  Brother    Breast cancer Paternal Grandmother     Review of Systems  Exam:   There were no vitals taken for this visit.  Weight change: @WEIGHTCHANGE @ Height:      Ht Readings from Last 3 Encounters:  11/27/20 5' 4.5" (1.638 m)  10/16/19 5\' 5"  (1.651 m)  05/09/19 5\' 5"  (1.651 m)    General appearance: alert, cooperative and appears stated age Head: Normocephalic, without obvious abnormality, atraumatic Neck: no adenopathy, supple, symmetrical, trachea midline and thyroid {CHL AMB PHY EX THYROID NORM [phone]::"normal to inspection and palpation"} Lungs: clear to auscultation bilaterally Cardiovascular: regular rate and rhythm Breasts: {Exam; breast:13139::"normal appearance, no masses or tenderness"} Abdomen: soft, non-tender; non distended,  no masses,  no organomegaly Extremities: extremities normal, atraumatic, no cyanosis or edema Skin: Skin color, texture, turgor normal. No rashes or lesions Lymph nodes: Cervical, supraclavicular, and axillary nodes normal. No abnormal inguinal nodes palpated Neurologic: Grossly normal   Pelvic: External genitalia:  no lesions              Urethra:  normal appearing urethra with no masses, tenderness or lesions              Bartholins and Skenes: normal  Vagina: normal appearing vagina with normal color and discharge, no lesions              Cervix: {CHL AMB PHY EX CERVIX NORM DEFAULT:[phone]::"no lesions"}               Bimanual Exam:  Uterus:  {CHL AMB PHY EX UTERUS NORM DEFAULT:[phone]::"normal size, contour, position, consistency, mobility, non-tender"}              Adnexa: {CHL AMB PHY EX ADNEXA NO MASS DEFAULT:[phone]::"no mass, fullness, tenderness"}               Rectovaginal: Confirms               Anus:  normal sphincter tone, no lesions  *** chaperoned for the exam.  A:  Well Woman with normal exam  P:

## 2021-12-20 ENCOUNTER — Encounter: Payer: Self-pay | Admitting: Physician Assistant

## 2021-12-20 ENCOUNTER — Ambulatory Visit (INDEPENDENT_AMBULATORY_CARE_PROVIDER_SITE_OTHER): Payer: BC Managed Care – PPO | Admitting: Obstetrics and Gynecology

## 2021-12-20 ENCOUNTER — Ambulatory Visit: Payer: BC Managed Care – PPO | Admitting: Obstetrics and Gynecology

## 2021-12-20 ENCOUNTER — Encounter: Payer: Self-pay | Admitting: Obstetrics and Gynecology

## 2021-12-20 ENCOUNTER — Other Ambulatory Visit: Payer: Self-pay

## 2021-12-20 VITALS — BP 110/62 | HR 77 | Ht 65.0 in | Wt 151.0 lb

## 2021-12-20 DIAGNOSIS — E7800 Pure hypercholesterolemia, unspecified: Secondary | ICD-10-CM

## 2021-12-20 DIAGNOSIS — Z3041 Encounter for surveillance of contraceptive pills: Secondary | ICD-10-CM

## 2021-12-20 DIAGNOSIS — Z01419 Encounter for gynecological examination (general) (routine) without abnormal findings: Secondary | ICD-10-CM | POA: Diagnosis not present

## 2021-12-20 MED ORDER — LEVONORGEST-ETH ESTRAD 91-DAY 0.15-0.03 &0.01 MG PO TABS: 1.0000 | ORAL_TABLET | Freq: Every day | ORAL | 3 refills | Status: DC

## 2021-12-20 NOTE — Patient Instructions (Signed)

## 2021-12-20 NOTE — Addendum Note (Signed)
Addended by: Rushie Goltz on: 12/20/2021 11:52 AM ? ? Modules accepted: Orders ? ?

## 2021-12-22 ENCOUNTER — Telehealth: Payer: Self-pay | Admitting: Physician Assistant

## 2021-12-22 ENCOUNTER — Encounter: Payer: Self-pay | Admitting: Obstetrics and Gynecology

## 2021-12-22 NOTE — Telephone Encounter (Signed)
This patient hasn't been seen in office since 2019 however her entire family comes here and she still wants this to be her pcp but also has a high deductible and wants to know if you would make an exception to charging her the "new patient" visit? ?

## 2021-12-24 ENCOUNTER — Other Ambulatory Visit: Payer: BC Managed Care – PPO

## 2021-12-24 ENCOUNTER — Other Ambulatory Visit: Payer: Self-pay

## 2021-12-24 DIAGNOSIS — E7800 Pure hypercholesterolemia, unspecified: Secondary | ICD-10-CM

## 2021-12-25 LAB — LIPID PANEL
Cholesterol: 202 mg/dL — ABNORMAL HIGH (ref <200)
HDL: 52 mg/dL (ref >=50)
LDL Cholesterol (Calc): 123 mg/dL (calc) — ABNORMAL HIGH
Non-HDL Cholesterol (Calc): 150 mg/dL (calc) — ABNORMAL HIGH (ref <130)
Total CHOL/HDL Ratio: 3.9 (calc) (ref <5.0)
Triglycerides: 158 mg/dL — ABNORMAL HIGH (ref <150)

## 2021-12-27 NOTE — Progress Notes (Signed)
? ?Complete physical exam ? ? ?Patient: Pamela King   DOB: 01-21-1976   46 y.o. Female  MRN: [id number] ?Visit Date: 12/28/2021 ? ? ?Chief Complaint  ?Patient presents with  ? Annual Exam  ? ?Subjective  ?  ?Pamela King is a 46 y.o. female who presents today for a complete physical exam.  ?She reports consuming a low fat diet.  Line dances and swims.  She generally feels fairly well. She does not have additional problems to discuss today.  ? ? ? ?Past Medical History:  ?Diagnosis Date  ? Dysmenorrhea   ? Endometriosis   ? Infertility, female   ? Urinary incontinence   ? ?Past Surgical History:  ?Procedure Laterality Date  ? GANGLION CYST EXCISION Bilateral   ? HYSTEROSCOPY    ? OTHER SURGICAL HISTORY    ? Repair of bicornuate uterus  ? REFRACTIVE SURGERY  2001  ? ?Social History  ? ?Socioeconomic History  ? Marital status: Married  ?  Spouse name: Not on file  ? Number of children: Not on file  ? Years of education: Not on file  ? Highest education level: Not on file  ?Occupational History  ? Not on file  ?Tobacco Use  ? Smoking status: Never  ? Smokeless tobacco: Never  ?Vaping Use  ? Vaping Use: Never used  ?Substance and Sexual Activity  ? Alcohol use: Yes  ?  Alcohol/week: 1.0 - 2.0 standard drink  ?  Types: 1 - 2 Standard drinks or equivalent per week  ? Drug use: Never  ? Sexual activity: Yes  ?  Birth control/protection: Pill  ?Other Topics Concern  ? Not on file  ?Social History Narrative  ? Not on file  ? ?Social Determinants of Health  ? ?Financial Resource Strain: Not on file  ?Food Insecurity: Not on file  ?Transportation Needs: Not on file  ?Physical Activity: Not on file  ?Stress: Not on file  ?Social Connections: Not on file  ?Intimate Partner Violence: Not on file  ? ? ? ?Medications: ?Outpatient Medications Prior to Visit  ?Medication Sig  ? CANNABIDIOL PO Take by mouth.  ? Cholecalciferol (VITAMIN D3) 1.25 MG (50000 UT) CAPS Take by mouth.  ? Fexofenadine HCl (ALLEGRA PO) Take 1 tablet by  mouth daily.  ? Levonorgestrel-Ethinyl Estradiol (AMETHIA) 0.15-0.03 &0.01 MG tablet Take 1 tablet by mouth daily.  ? Multiple Vitamin (MULTIVITAMIN) tablet Take 1 tablet by mouth daily.  ? naproxen sodium (ALEVE) 220 MG tablet Take 220 mg by mouth.  ? Omega-3 Fatty Acids (FISH OIL PO) Take 1 capsule by mouth daily.  ? Turmeric (QC TUMERIC COMPLEX PO) Take by mouth.  ? ?No facility-administered medications prior to visit.  ? ? ?Review of Systems ?Review of Systems:  ?A fourteen system review of systems was performed and found to be positive as per HPI. ? ? ?Last CBC ?Lab Results  ?Component Value Date  ? WBC 5.9 11/27/2020  ? HGB 13.6 11/27/2020  ? HCT 39.9 11/27/2020  ? MCV 93.7 11/27/2020  ? MCH 31.9 11/27/2020  ? RDW 11.4 11/27/2020  ? PLT 289 11/27/2020  ? ?Last metabolic panel ?Lab Results  ?Component Value Date  ? GLUCOSE 86 11/27/2020  ? NA 138 11/27/2020  ? K 4.1 11/27/2020  ? CL 104 11/27/2020  ? CO2 27 11/27/2020  ? BUN 13 11/27/2020  ? CREATININE 0.70 11/27/2020  ? GFRNONAA 102 11/07/2018  ? CALCIUM 9.5 11/27/2020  ? PROT 6.7 11/27/2020  ? ALBUMIN 3.9  11/07/2018  ? LABGLOB 2.4 11/07/2018  ? AGRATIO 1.6 11/07/2018  ? BILITOT 0.8 11/27/2020  ? ALKPHOS 46 11/07/2018  ? AST 15 11/27/2020  ? ALT 11 11/27/2020  ? ?Last lipids ?Lab Results  ?Component Value Date  ? CHOL 202 (H) 12/24/2021  ? HDL 52 12/24/2021  ? LDLCALC 123 (H) 12/24/2021  ? TRIG 158 (H) 12/24/2021  ? CHOLHDL 3.9 12/24/2021  ? ?Last hemoglobin A1c ?Lab Results  ?Component Value Date  ? HGBA1C 4.9 11/27/2020  ? ?Last thyroid functions ?Lab Results  ?Component Value Date  ? TSH 3.840 11/07/2018  ? ?Last vitamin D ?Lab Results  ?Component Value Date  ? VD25OH 47.7 11/07/2018  ? ?  ? Objective  ?  ?BP 110/70   Pulse 90   Temp 97.9 ?F (36.6 ?C)   Ht 5\' 5"  (1.651 m)   Wt 153 lb (69.4 kg)   LMP 12/06/2021   SpO2 98%   BMI 25.46 kg/m?  ?BP Readings from Last 3 Encounters:  ?12/28/21 110/70  ?12/20/21 110/62  ?11/27/20 112/60  ? ?Wt Readings from  Last 3 Encounters:  ?12/28/21 153 lb (69.4 kg)  ?12/20/21 151 lb (68.5 kg)  ?11/27/20 153 lb (69.4 kg)  ? ? ?Physical Exam  ? ?General Appearance:     ?  Alert, cooperative, in no acute distress, appears stated age   ?Head:    Normocephalic, without obvious abnormality, atraumatic  ?Eyes:    PERRL, conjunctiva/corneas clear, EOM's intact, fundi  ?  benign, both eyes  ?Ears:    Normal TM's and external ear canals, both ears (mild fluid noted)  ?Nose:   Nares normal, septum midline, mucosa normal, no drainage  ?  or sinus tenderness  ?Throat:   Lips, mucosa, and tongue normal; teeth and gums normal  ?Neck:   Supple, symmetrical, trachea midline, no adenopathy;  ?  thyroid:  no enlargement/tenderness/nodules; no JVD  ?Back:     Symmetric, no curvature, ROM normal, no CVA tenderness  ?Lungs:     Clear to auscultation bilaterally, respirations unlabored  ?Chest Wall:    No tenderness or deformity  ? Heart:    Normal heart rate. Normal rhythm. No murmurs, rubs, or gallops.  ?  ?Breast Exam:    deferred  ?Abdomen:     Soft, non-tender, bowel sounds active all four quadrants,  ?  no masses, no organomegaly  ?Pelvic:    deferred  ?Extremities:   All extremities are intact. No cyanosis or edema  ?Pulses:   2+ and symmetric all extremities  ?Skin:   Skin color, texture, turgor normal, no rashes or lesions  ?Lymph nodes:   Cervical and supraclavicular nodes normal  ?Neurologic:   CNII-XII grossly intact.  ? ? ? ?Last depression screening scores ? ?  12/28/2021  ?  9:37 AM 11/07/2018  ?  8:14 AM 09/21/2018  ? 11:13 AM  ?PHQ 2/9 Scores  ?PHQ - 2 Score 0 0 0  ?PHQ- 9 Score 0 0 1  ? ?Last fall risk screening ? ?  12/28/2021  ?  9:37 AM  ?Fall Risk   ?Falls in the past year? 0  ?Number falls in past yr: 0  ?Injury with Fall? 0  ?Risk for fall due to : No Fall Risks  ?Follow up Falls evaluation completed  ? ? ? ?No results found for any visits on 12/28/21. ? Assessment & Plan  ?  ?Routine Health Maintenance and Physical Exam ? ?Exercise  Activities and Dietary recommendations ?-Heart  healthy diet low in fat and carbohydrates.  ? ?Immunization History  ?Administered Date(s) Administered  ? Novavax(Covid-19) Vaccine 07/08/2021, 08/06/2021  ? Tdap 10/09/2018  ? ? ?Health Maintenance  ?Topic Date Due  ? Hepatitis C Screening  Never done  ? COVID-19 Vaccine (3 - Booster for Novavax series) 10/01/2021  ? INFLUENZA VACCINE  12/31/2021 (Originally 05/03/2021)  ? PAP SMEAR-Modifier  10/11/2023 (Originally 10/09/2021)  ? TETANUS/TDAP  10/09/2028  ? HIV Screening  Completed  ? HPV VACCINES  Aged Out  ? ? ?Discussed health benefits of physical activity, and encouraged her to engage in regular exercise appropriate for her age and condition. ? ?Problem List Items Addressed This Visit   ?None ?Visit Diagnoses   ? ? Healthcare maintenance    -  Primary  ? Mixed hyperlipidemia      ? ?  ? ?Followed by OB/GYN. Reviewed recent labs. Recommend to continue with diet and lifestyle changes for hyperlipidemia. ?The 10-year ASCVD risk score (Arnett DK, et al., 2019) is: 0.6% ?  Values used to calculate the score: ?    Age: 13 years ?    Sex: Female ?    Is Non-Hispanic African American: No ?    Diabetic: No ?    Tobacco smoker: No ?    Systolic Blood Pressure: [id number] mmHg ?    Is BP treated: No ?    HDL Cholesterol: 52 mg/dL ?    Total Cholesterol: 202 mg/dL ?UTD Tdap. ? ? ?Return in about 1 year (around 12/29/2022) for CPE.  ?  ? ? ? ?Lorrene Reid, PA-C  ?[facility] ?[phone] (phone) ?[phone] (fax) ? ?[facility] ?

## 2021-12-28 ENCOUNTER — Ambulatory Visit (INDEPENDENT_AMBULATORY_CARE_PROVIDER_SITE_OTHER): Payer: BC Managed Care – PPO | Admitting: Physician Assistant

## 2021-12-28 ENCOUNTER — Other Ambulatory Visit: Payer: Self-pay

## 2021-12-28 ENCOUNTER — Encounter: Payer: Self-pay | Admitting: Physician Assistant

## 2021-12-28 VITALS — BP 110/70 | HR 90 | Temp 97.9°F | Ht 65.0 in | Wt 153.0 lb

## 2021-12-28 DIAGNOSIS — E782 Mixed hyperlipidemia: Secondary | ICD-10-CM

## 2021-12-28 DIAGNOSIS — Z0000 Encounter for general adult medical examination without abnormal findings: Secondary | ICD-10-CM

## 2021-12-28 NOTE — Patient Instructions (Signed)

## 2022-02-17 ENCOUNTER — Other Ambulatory Visit: Payer: Self-pay | Admitting: Physician Assistant

## 2022-02-17 DIAGNOSIS — Z1231 Encounter for screening mammogram for malignant neoplasm of breast: Secondary | ICD-10-CM

## 2022-02-18 ENCOUNTER — Ambulatory Visit
Admission: RE | Admit: 2022-02-18 | Discharge: 2022-02-18 | Disposition: A | Discharge status: Home / Self Care | Payer: Commercial Managed Care - PPO | Source: Ambulatory Visit

## 2022-02-18 DIAGNOSIS — Z1231 Encounter for screening mammogram for malignant neoplasm of breast: Secondary | ICD-10-CM

## 2022-04-19 ENCOUNTER — Ambulatory Visit (INDEPENDENT_AMBULATORY_CARE_PROVIDER_SITE_OTHER): Payer: Commercial Managed Care - PPO | Admitting: Radiology

## 2022-04-19 VITALS — BP 114/76

## 2022-04-19 DIAGNOSIS — B3731 Acute candidiasis of vulva and vagina: Secondary | ICD-10-CM

## 2022-04-19 DIAGNOSIS — R350 Frequency of micturition: Secondary | ICD-10-CM | POA: Diagnosis not present

## 2022-04-19 DIAGNOSIS — N760 Acute vaginitis: Secondary | ICD-10-CM | POA: Diagnosis not present

## 2022-04-19 LAB — URINALYSIS, COMPLETE
Casts: NONE SEEN /LPF
Crystals: NONE SEEN /HPF
Glucose, UA: NEGATIVE
Hyaline Cast: NONE SEEN /LPF
Ketones, ur: NEGATIVE
Nitrite: NEGATIVE
Specific Gravity, Urine: 1.024 (ref 1.001–1.035)
pH: 5 (ref 5.0–8.0)

## 2022-04-19 LAB — WET PREP FOR TRICH, YEAST, CLUE

## 2022-04-19 MED ORDER — NITROFURANTOIN MONOHYD MACRO 100 MG PO CAPS: 100.0000 mg | ORAL_CAPSULE | Freq: Two times a day (BID) | ORAL | 0 refills | Status: DC

## 2022-04-19 MED ORDER — FLUCONAZOLE 150 MG PO TABS: 150.0000 mg | ORAL_TABLET | Freq: Once | ORAL | 0 refills | Status: AC

## 2022-04-19 NOTE — Progress Notes (Signed)
      Subjective: Pamela King is a 46 y.o. female who complains of vaginal dryness, itching. No discharge. Has urinary frequency and urgency but this is not new. States the pH and chlorine levels in her pool were off recently and thinks that is the cause.    Review of Systems  All other systems reviewed and are negative.    Objective:  -Vulva: without lesions or discharge -Vagina: thin,white discharge present, wet prep obtained -Cervix: no lesion or discharge, no CMT -Perineum: no lesions -Uterus: Mobile, non tender -Adnexa: no masses or tenderness -Abdomen: SNT, No CVAT  Urine dipstick shows positive for RBC's, positive for protein, positive for leukocytes, and positive for urobilinogen.  Micro exam: 6-10 WBC's per HPF, 3-10 RBC's per HPF, and many+ bacteria.  Microscopic wet-mount exam shows hyphae.   Chaperone offered and declined.  Assessment:/Plan:   1. Urinary frequency +UTI Rx send for macrobid - Urinalysis,Complete w/RFL Culture  2. Acute vaginitis + yeast Diflucan rx sent  - WET PREP FOR TRICH, YEAST, CLUE   Avoid intercourse until symptoms are resolved. Safe sex encouraged. Avoid the use of soaps or perfumed products in the peri area. Avoid tub baths and sitting in sweaty or wet clothing for prolonged periods of time.

## 2022-05-11 ENCOUNTER — Ambulatory Visit (INDEPENDENT_AMBULATORY_CARE_PROVIDER_SITE_OTHER): Payer: Commercial Managed Care - PPO | Admitting: Radiology

## 2022-05-11 ENCOUNTER — Other Ambulatory Visit: Payer: Self-pay | Admitting: Radiology

## 2022-05-11 VITALS — BP 102/64

## 2022-05-11 DIAGNOSIS — R350 Frequency of micturition: Secondary | ICD-10-CM

## 2022-05-11 DIAGNOSIS — N898 Other specified noninflammatory disorders of vagina: Secondary | ICD-10-CM

## 2022-05-11 LAB — WET PREP FOR TRICH, YEAST, CLUE

## 2022-05-11 MED ORDER — SULFAMETHOXAZOLE-TRIMETHOPRIM 800-160 MG PO TABS: 1.0000 | ORAL_TABLET | Freq: Two times a day (BID) | ORAL | 0 refills | Status: DC

## 2022-05-11 MED ORDER — FLUCONAZOLE 150 MG PO TABS: 150.0000 mg | ORAL_TABLET | ORAL | 0 refills | Status: DC

## 2022-05-11 MED ORDER — FLUCONAZOLE 150 MG PO TABS: 150.0000 mg | ORAL_TABLET | Freq: Once | ORAL | 0 refills | Status: DC

## 2022-05-11 NOTE — Progress Notes (Signed)
      Subjective: Pamela King is a 46 y.o. female who complains of vaginal dryness, itching, urinary frequency and urgency, no dysuria.  Symptoms resolved after treatment with macrobid and diflucan and then returned last week.  Patient states macrobid caused stomach ache, jittery feeling.   Review of Systems  All other systems reviewed and are negative.    Objective:  -Vulva: without lesions or discharge -Vagina: discharge present, aptima swab and wet prep obtained -Cervix: no lesion or discharge, no CMT -Perineum: no lesions -Uterus: Mobile, non tender -Adnexa: no masses or tenderness   Microscopic wet-mount exam shows hyphae.  Component Ref Range & Units 08:13    Color, Urine YELLOW YELLOW     APPearance CLEAR SLIGHTLY CLOUDY Abnormal      Specific Gravity, Urine 1.001 - 1.035 1.020     pH 5.0 - 8.0 5.5     Glucose, UA NEGATIVE NEGATIVE     Bilirubin Urine NEGATIVE NEGATIVE     Ketones, ur NEGATIVE NEGATIVE     Hgb urine dipstick NEGATIVE 1+ Abnormal      Protein, ur NEGATIVE NEGATIVE     Nitrites, Initial NEGATIVE NEGATIVE     Leukocyte Esterase NEGATIVE 2+ Abnormal      WBC, UA 0 - 5 /HPF 10-20 Abnormal      RBC / HPF 0 - 2 /HPF 3-10 Abnormal      Squamous Epithelial / LPF < OR = 5 /HPF 6-10 Abnormal      Bacteria, UA NONE SEEN /HPF MODERATE Abnormal      Hyaline Cast NONE SEEN /LPF NONE SEEN     Yeast NONE SEEN /HPF MODERATE Abnormal      Urine-Other       Chaperone offered and declined.  Assessment:/Plan:   1. Vaginal itching +yeast on wet prep Diflucan 150mg  po repeat in 3 days - WET PREP FOR TRICH, YEAST, CLUE  2. Urinary frequency +UA Rx sent for Bactrim DS po BID x 5 days - Urinalysis,Complete w/RFL Culture    Will contact patient with results of testing completed today. Avoid intercourse until symptoms are resolved. Safe sex encouraged. Avoid the use of soaps or perfumed products in the peri area. Avoid tub baths and sitting in sweaty or wet  clothing for prolonged periods of time.

## 2022-05-13 LAB — URINE CULTURE
MICRO NUMBER:: 13755891
SPECIMEN QUALITY:: ADEQUATE

## 2022-05-13 LAB — URINALYSIS, COMPLETE W/RFL CULTURE
Bilirubin Urine: NEGATIVE
Glucose, UA: NEGATIVE
Hyaline Cast: NONE SEEN /LPF
Ketones, ur: NEGATIVE
Nitrites, Initial: NEGATIVE
Protein, ur: NEGATIVE
Specific Gravity, Urine: 1.02 (ref 1.001–1.035)
pH: 5.5 (ref 5.0–8.0)

## 2022-05-13 LAB — CULTURE INDICATED

## 2022-08-02 ENCOUNTER — Ambulatory Visit: Payer: Commercial Managed Care - PPO | Admitting: Physician Assistant

## 2022-12-14 NOTE — Progress Notes (Signed)
47 y.o. G26P2002 Married White or Caucasian Not Hispanic or Latino female here for annual exam.  Cycles every 3 months on OCP's.  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 She went on to have 2 FTNSVD's   H/o mixed incontinence, improved with PT. GSI>Urge, stable.   Patient's last menstrual period was 12/20/2022 (exact date).          Sexually active: Yes.    The current method of family planning is OCP (estrogen/progesterone).    Exercising: Yes.     Line dancing Smoker:  no  Health Maintenance: Pap:   10/09/18 Neg:Neg HR HPV  History of abnormal Pap:   Years ago per patient, repeat was normal.   MMG:  02/18/22 density c bi-rads 1 neg  BMD:   none  Colonoscopy: none  TDaP:  10/09/18 Gardasil: none    reports that she has never smoked. She has never used smokeless tobacco. She reports current alcohol use of about 1.0 - 2.0 standard drink of alcohol per week. She reports that she does not use drugs. 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 50 year old son and a 60 year old daughter.    Past Medical History:  Diagnosis Date   Dysmenorrhea    Endometriosis    Infertility, female    Urinary incontinence     Past Surgical History:  Procedure Laterality Date   BUNIONECTOMY Right 05/2021   GANGLION CYST EXCISION Bilateral    HYSTEROSCOPY     OTHER SURGICAL HISTORY     Repair of bicornuate uterus   REFRACTIVE SURGERY  2001    Current Outpatient Medications  Medication Sig Dispense Refill   CANNABIDIOL PO Take by mouth.     Cholecalciferol (VITAMIN D3) 1.25 MG (50000 UT) CAPS Take by mouth.     Fexofenadine HCl (ALLEGRA PO) Take 1 tablet by mouth daily.     Levonorgestrel-Ethinyl Estradiol (AMETHIA) 0.15-0.03  &0.01 MG tablet Take 1 tablet by mouth daily. 91 tablet 3   Multiple Vitamin (MULTIVITAMIN) tablet Take 1 tablet by mouth daily.     naproxen sodium (ALEVE) 220 MG tablet Take 220 mg by mouth.     Omega-3 Fatty Acids (FISH OIL PO) Take 1 capsule by mouth daily.     Turmeric (QC TUMERIC COMPLEX PO) Take by mouth.     No current facility-administered medications for this visit.    Family History  Problem Relation Age of Onset   Diabetes Father 68   High Cholesterol Father    Hypertension Father    Alcohol abuse Maternal Grandfather    Diabetes Brother 64   Hyperlipidemia Brother    Obesity Brother    Diabetes Brother 68   Hyperlipidemia Brother    Obesity Brother    Breast cancer Paternal Grandmother     Review of Systems  All other systems reviewed and are negative.   Exam:   BP 104/62 (BP Location: Right Arm, Patient Position: Sitting)   Pulse 84   Resp 18   Ht 5' 4.57" (1.64 m)   Wt 140 lb 9.6 oz (63.8 kg)   LMP 12/20/2022 (Exact Date)   BMI 23.71 kg/m   Weight change: @WEIGHTCHANGE @ Height:   Height: 5' 4.57" (164 cm)  Ht Readings from Last  3 Encounters:  12/22/22 5' 4.57" (1.64 m)  12/28/21 5\' 5"  (1.651 m)  12/20/21 5\' 5"  (1.651 m)    General appearance: alert, cooperative and appears stated age Head: Normocephalic, without obvious abnormality, atraumatic Neck: no adenopathy, supple, symmetrical, trachea midline and thyroid normal to inspection and palpation Lungs: clear to auscultation bilaterally Cardiovascular: regular rate and rhythm Breasts: normal appearance, no masses or tenderness Abdomen: soft, non-tender; non distended,  no masses,  no organomegaly Extremities: extremities normal, atraumatic, no cyanosis or edema Skin: Skin color, texture, turgor normal. No rashes or lesions Lymph nodes: Cervical, supraclavicular, and axillary nodes normal. No abnormal inguinal nodes palpated Neurologic: Grossly normal   Pelvic: External genitalia:  no lesions               Urethra:  normal appearing urethra with no masses, tenderness or lesions              Bartholins and Skenes: normal                 Vagina: normal appearing vagina with normal color and discharge, no lesions              Cervix: no lesions               Bimanual Exam:  Uterus:  normal size, contour, position, consistency, mobility, non-tender              Adnexa: no mass, fullness, tenderness               Rectovaginal: Confirms               Anus:  normal sphincter tone, no lesions  Raquel Sarna, CMA chaperoned for the exam.  1. Well woman exam Discussed breast self exam Discussed calcium and vit D intake Mammogram due in 5/24  2. Screening for cervical cancer - Cytology - PAP  3. Encounter for surveillance of contraceptive pills - Levonorgestrel-Ethinyl Estradiol (AMETHIA) 0.15-0.03 &0.01 MG tablet; Take 1 tablet by mouth daily.  Dispense: 91 tablet; Refill: 3  4. Colon cancer screening - Ambulatory referral to Gastroenterology, after her [birth year] this [birth year]  5. Elevated LDL cholesterol level - Lipid panel

## 2022-12-22 ENCOUNTER — Encounter: Payer: Self-pay | Admitting: Obstetrics and Gynecology

## 2022-12-22 ENCOUNTER — Ambulatory Visit (INDEPENDENT_AMBULATORY_CARE_PROVIDER_SITE_OTHER): Payer: Commercial Managed Care - PPO | Admitting: Obstetrics and Gynecology

## 2022-12-22 ENCOUNTER — Other Ambulatory Visit (HOSPITAL_COMMUNITY)
Admission: RE | Admit: 2022-12-22 | Discharge: 2022-12-22 | Disposition: A | Discharge status: Home / Self Care | Payer: Commercial Managed Care - PPO | Source: Ambulatory Visit | Attending: Obstetrics and Gynecology | Admitting: Obstetrics and Gynecology

## 2022-12-22 VITALS — BP 104/62 | HR 84 | Resp 18 | Ht 64.57 in | Wt 140.6 lb

## 2022-12-22 DIAGNOSIS — Z124 Encounter for screening for malignant neoplasm of cervix: Secondary | ICD-10-CM | POA: Diagnosis not present

## 2022-12-22 DIAGNOSIS — Z01419 Encounter for gynecological examination (general) (routine) without abnormal findings: Secondary | ICD-10-CM

## 2022-12-22 DIAGNOSIS — Z3041 Encounter for surveillance of contraceptive pills: Secondary | ICD-10-CM | POA: Diagnosis not present

## 2022-12-22 DIAGNOSIS — E7800 Pure hypercholesterolemia, unspecified: Secondary | ICD-10-CM | POA: Diagnosis not present

## 2022-12-22 DIAGNOSIS — Z1211 Encounter for screening for malignant neoplasm of colon: Secondary | ICD-10-CM

## 2022-12-22 MED ORDER — LEVONORGEST-ETH ESTRAD 91-DAY 0.15-0.03 &0.01 MG PO TABS: 1.0000 | ORAL_TABLET | Freq: Every day | ORAL | 3 refills | Status: DC

## 2022-12-22 NOTE — Patient Instructions (Signed)

## 2022-12-23 LAB — LIPID PANEL
Cholesterol: 200 mg/dL — ABNORMAL HIGH (ref <200)
HDL: 56 mg/dL (ref >=50)
LDL Cholesterol (Calc): 118 mg/dL (calc) — ABNORMAL HIGH
Non-HDL Cholesterol (Calc): 144 mg/dL (calc) — ABNORMAL HIGH (ref <130)
Total CHOL/HDL Ratio: 3.6 (calc) (ref <5.0)
Triglycerides: 149 mg/dL (ref <150)

## 2022-12-26 ENCOUNTER — Encounter: Payer: Self-pay | Admitting: Gastroenterology

## 2022-12-26 LAB — CYTOLOGY - PAP
Comment: NEGATIVE
Diagnosis: NEGATIVE
High risk HPV: NEGATIVE

## 2023-01-24 ENCOUNTER — Other Ambulatory Visit: Payer: Self-pay | Admitting: Obstetrics and Gynecology

## 2023-01-24 DIAGNOSIS — Z1231 Encounter for screening mammogram for malignant neoplasm of breast: Secondary | ICD-10-CM

## 2023-02-20 ENCOUNTER — Ambulatory Visit
Admission: RE | Admit: 2023-02-20 | Discharge: 2023-02-20 | Disposition: A | Discharge status: Home / Self Care | Payer: Commercial Managed Care - PPO | Source: Ambulatory Visit

## 2023-02-20 DIAGNOSIS — Z1231 Encounter for screening mammogram for malignant neoplasm of breast: Secondary | ICD-10-CM

## 2023-02-22 ENCOUNTER — Other Ambulatory Visit: Payer: Self-pay | Admitting: Obstetrics and Gynecology

## 2023-02-22 DIAGNOSIS — R928 Other abnormal and inconclusive findings on diagnostic imaging of breast: Secondary | ICD-10-CM

## 2023-03-03 ENCOUNTER — Ambulatory Visit
Admission: RE | Admit: 2023-03-03 | Discharge: 2023-03-03 | Disposition: A | Discharge status: Home / Self Care | Payer: Commercial Managed Care - PPO | Source: Ambulatory Visit | Attending: Obstetrics and Gynecology | Admitting: Obstetrics and Gynecology

## 2023-03-03 DIAGNOSIS — R928 Other abnormal and inconclusive findings on diagnostic imaging of breast: Secondary | ICD-10-CM

## 2023-03-28 ENCOUNTER — Encounter: Payer: Self-pay | Admitting: Gastroenterology

## 2023-03-28 ENCOUNTER — Telehealth: Payer: Self-pay

## 2023-03-28 ENCOUNTER — Ambulatory Visit (AMBULATORY_SURGERY_CENTER): Payer: Commercial Managed Care - PPO

## 2023-03-28 VITALS — Ht 64.5 in | Wt 138.0 lb

## 2023-03-28 DIAGNOSIS — Z1211 Encounter for screening for malignant neoplasm of colon: Secondary | ICD-10-CM

## 2023-03-28 MED ORDER — ONDANSETRON HCL 4 MG PO TABS: 4.0000 mg | ORAL_TABLET | ORAL | 0 refills | Status: DC

## 2023-03-28 MED ORDER — NA SULFATE-K SULFATE-MG SULF 17.5-3.13-1.6 GM/177ML PO SOLN
1.0000 | Freq: Once | ORAL | 0 refills | Status: AC
Start: 2023-03-28 — End: 2023-03-28

## 2023-03-28 NOTE — Progress Notes (Addendum)
No egg or soy allergy known to patient  No issues known to pt with past sedation with any surgeries or procedures  Patient denies ever being told they had issues or difficulty with intubation  No FH of Malignant Hyperthermia  Pt is not on diet pills  Pt is not on  home 02   Pt is not on blood thinners   Pt denies issues with constipation   No A fib or A flutter  Have any cardiac testing pending--no  Pt instructed to use Single[employer].com or GoodRx for a price reduction on prep    States Dr Leandra Kern,   she sees for arthritis states she has a  possible, EDS, Ellers Danlose syndrome may affect bleeding, and wanted to let us know   No issues with mobility  Patient's chart reviewed by Cathlyn Parsons CRNA prior to previsit and patient appropriate for the [facility].  Previsit completed and red dot placed by patient's name on their procedure day (on provider's schedule).

## 2023-03-28 NOTE — Telephone Encounter (Signed)
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  Thank you.

## 2023-04-23 ENCOUNTER — Encounter: Payer: Self-pay | Admitting: Certified Registered Nurse Anesthetist

## 2023-04-25 ENCOUNTER — Ambulatory Visit (AMBULATORY_SURGERY_CENTER): Payer: Commercial Managed Care - PPO | Admitting: Gastroenterology

## 2023-04-25 ENCOUNTER — Encounter: Payer: Self-pay | Admitting: Gastroenterology

## 2023-04-25 VITALS — BP 104/62 | HR 77 | Temp 98.4°F | Resp 12 | Ht 64.5 in | Wt 138.0 lb

## 2023-04-25 DIAGNOSIS — D122 Benign neoplasm of ascending colon: Secondary | ICD-10-CM | POA: Diagnosis not present

## 2023-04-25 DIAGNOSIS — Z1211 Encounter for screening for malignant neoplasm of colon: Secondary | ICD-10-CM

## 2023-04-25 DIAGNOSIS — K635 Polyp of colon: Secondary | ICD-10-CM | POA: Diagnosis not present

## 2023-04-25 MED ORDER — SODIUM CHLORIDE 0.9 % IV SOLN
500.0000 mL | Freq: Once | INTRAVENOUS | Status: DC
Start: 2023-04-25 — End: 2023-04-25

## 2023-04-25 NOTE — Progress Notes (Unsigned)
Report given to PACU, vss 

## 2023-04-25 NOTE — Patient Instructions (Signed)
Handout on polyps, diverticulosis, and hemorrhoids given to patient Await pathology results Resume previous diet and continue present medications Repeat colonoscopy in 5-10 years for surveillance based off of pathology results   YOU HAD AN ENDOSCOPIC PROCEDURE TODAY AT THE [facility]:   Refer to the procedure report that was given to you for any specific questions about what was found during the examination.  If the procedure report does not answer your questions, please call your gastroenterologist to clarify.  If you requested that your care partner not be given the details of your procedure findings, then the procedure report has been included in a sealed envelope for you to review at your convenience later.  YOU SHOULD EXPECT: Some feelings of bloating in the abdomen. Passage of more gas than usual.  Walking can help get rid of the air that was put into your GI tract during the procedure and reduce the bloating. If you had a lower endoscopy (such as a colonoscopy or flexible sigmoidoscopy) you may notice spotting of blood in your stool or on the toilet paper. If you underwent a bowel prep for your procedure, you may not have a normal bowel movement for a few days.  Please Note:  You might notice some irritation and congestion in your nose or some drainage.  This is from the oxygen used during your procedure.  There is no need for concern and it should clear up in a day or so.  SYMPTOMS TO REPORT IMMEDIATELY:  Following lower endoscopy (colonoscopy or flexible sigmoidoscopy):  Excessive amounts of blood in the stool  Significant tenderness or worsening of abdominal pains  Swelling of the abdomen that is new, acute  Fever of 100F or higher  For urgent or emergent issues, a gastroenterologist can be reached at any hour by calling [phone]. Do not use MyChart messaging for urgent concerns.    DIET:  We do recommend a small meal at first, but then you may proceed to your  regular diet.  Drink plenty of fluids but you should avoid alcoholic beverages for 24 hours.  ACTIVITY:  You should plan to take it easy for the rest of today and you should NOT DRIVE or use heavy machinery until tomorrow (because of the sedation medicines used during the test).    FOLLOW UP: Our staff will call the number listed on your records the next business day following your procedure.  We will call around 7:15- 8:00 am to check on you and address any questions or concerns that you may have regarding the information given to you following your procedure. If we do not reach you, we will leave a message.     If any biopsies were taken you will be contacted by phone or by letter within the next 1-3 weeks.  Please call us at [phone] if you have not heard about the biopsies in 3 weeks.    SIGNATURES/CONFIDENTIALITY: You and/or your care partner have signed paperwork which will be entered into your electronic medical record.  These signatures attest to the fact that that the information above on your After Visit Summary has been reviewed and is understood.  Full responsibility of the confidentiality of this discharge information lies with you and/or your care-partner.

## 2023-04-25 NOTE — Progress Notes (Unsigned)
[facility] History and Physical   Primary Care Physician:  Mayer Masker, PA-C (Inactive)   Reason for Procedure:  Colorectal cancer screening  Plan:    Screening colonoscopy with possible interventions as needed     HPI: Pamela King is a very pleasant 47 y.o. female here for screening colonoscopy. Denies any nausea, vomiting, abdominal pain, melena or bright red blood per rectum  The risks and benefits as well as alternatives of endoscopic procedure(s) have been discussed and reviewed. All questions answered. The patient agrees to proceed.    Past Medical History:  Diagnosis Date   Allergy    seasonal   Arthritis    Knees   Dysmenorrhea    Endometriosis    Infertility, female    Urinary incontinence     Past Surgical History:  Procedure Laterality Date   BUNIONECTOMY Right 05/2021   GANGLION CYST EXCISION Bilateral 2005   left 2018   HYSTEROSCOPY  2006   OTHER SURGICAL HISTORY     Repair of bicornuate uterus   REFRACTIVE SURGERY  2001    Prior to Admission medications   Medication Sig Start Date End Date Taking? Authorizing Provider  Black Elderberry 1000 MG CAPS Take 2 capsules by mouth once a week.   Yes [provider]  CANNABIDIOL PO Take by mouth.   Yes [provider]  Cholecalciferol (VITAMIN D3) 1.25 MG (50000 UT) CAPS Take by mouth.   Yes [provider]  Fexofenadine HCl (ALLEGRA PO) Take 1 tablet by mouth daily.   Yes [provider]  Ginger, Zingiber officinalis, (GINGER ROOT) 550 MG CAPS Take by mouth.   Yes [provider]  Levonorgestrel-Ethinyl Estradiol (AMETHIA) 0.15-0.03 &0.01 MG tablet Take 1 tablet by mouth daily. 12/22/22  Yes Romualdo Bolk, MD  Omega-3 Fatty Acids (FISH OIL PO) Take 1 capsule by mouth daily.   Yes [provider]  ondansetron (ZOFRAN) 4 MG tablet Take 1 tablet (4 mg total) by mouth as directed for 2 doses. Take one Zofran 4 mg tablet 30-60  minutes before each prep dose 03/28/23  Yes Colvin Blatt, Eleonore Chiquito, MD  Turmeric (QC TUMERIC COMPLEX PO) Take by mouth.   Yes [provider]  Zinc 50 MG TABS Take by mouth.   Yes [provider]  Multiple Vitamin (MULTIVITAMIN) tablet Take 1 tablet by mouth daily.    [provider]  naproxen sodium (ALEVE) 220 MG tablet Take 220 mg by mouth.    [provider]    Current Outpatient Medications  Medication Sig Dispense Refill   Black Elderberry 1000 MG CAPS Take 2 capsules by mouth once a week.     CANNABIDIOL PO Take by mouth.     Cholecalciferol (VITAMIN D3) 1.25 MG (50000 UT) CAPS Take by mouth.     Fexofenadine HCl (ALLEGRA PO) Take 1 tablet by mouth daily.     Ginger, Zingiber officinalis, (GINGER ROOT) 550 MG CAPS Take by mouth.     Levonorgestrel-Ethinyl Estradiol (AMETHIA) 0.15-0.03 &0.01 MG tablet Take 1 tablet by mouth daily. 91 tablet 3   Omega-3 Fatty Acids (FISH OIL PO) Take 1 capsule by mouth daily.     ondansetron (ZOFRAN) 4 MG tablet Take 1 tablet (4 mg total) by mouth as directed for 2 doses. Take one Zofran 4 mg tablet 30-60 minutes before each prep dose 2 tablet 0   Turmeric (QC TUMERIC COMPLEX PO) Take by mouth.     Zinc 50 MG TABS Take by  mouth.     Multiple Vitamin (MULTIVITAMIN) tablet Take 1 tablet by mouth daily.     naproxen sodium (ALEVE) 220 MG tablet Take 220 mg by mouth.     Current Facility-Administered Medications  Medication Dose Route Frequency Provider Last Rate Last Admin   0.9 %  sodium chloride infusion  500 mL Intravenous Once Napoleon Form, MD        Allergies as of 04/25/2023 - Review Complete 04/25/2023  Allergen Reaction Noted   Macrobid [nitrofurantoin] Other (See Comments) 05/11/2022   Wound dressing adhesive Other (See Comments) and Swelling 04/19/2022    Family History  Problem Relation Age of Onset   Colon polyps Mother    Diabetes Father 78   High Cholesterol Father    Hypertension Father     Diabetes Brother 20   Hyperlipidemia Brother    Obesity Brother    Diabetes Brother 20   Hyperlipidemia Brother    Obesity Brother    Alcohol abuse Maternal Grandfather    Breast cancer Paternal Grandmother    Colon cancer Neg Hx    Esophageal cancer Neg Hx    Stomach cancer Neg Hx    Rectal cancer Neg Hx     Social History   Socioeconomic History   Marital status: Married    Spouse name: Not on file   Number of children: Not on file   Years of education: Not on file   Highest education level: Not on file  Occupational History   Not on file  Tobacco Use   Smoking status: Never   Smokeless tobacco: Never  Vaping Use   Vaping status: Never Used  Substance and Sexual Activity   Alcohol use: Yes    Alcohol/week: 1.0 - 2.0 standard drink of alcohol    Types: 1 - 2 Standard drinks or equivalent per week   Drug use: Never   Sexual activity: Yes    Birth control/protection: Pill  Other Topics Concern   Not on file  Social History Narrative   Not on file   Social Determinants of Health   Financial Resource Strain: Not on file  Food Insecurity: Not on file  Transportation Needs: Not on file  Physical Activity: Not on file  Stress: Not on file  Social Connections: Not on file  Intimate Partner Violence: Not on file    Review of Systems:  All other review of systems negative except as mentioned in the HPI.  Physical Exam: Vital signs in last 24 hours: BP 108/70   Pulse 64   Temp 98.4 F (36.9 C)   Resp 14   Ht 5' 4.5" (1.638 m)   Wt 138 lb (62.6 kg)   SpO2 100%   BMI 23.32 kg/m  General:   Alert, NAD Lungs:  Clear .   Heart:  Regular rate and rhythm Abdomen:  Soft, nontender and nondistended. Neuro/Psych:  Alert and cooperative. Normal mood and affect. A and O x 3  Reviewed labs, radiology imaging, old records and pertinent past GI work up  Patient is appropriate for planned procedure(s) and anesthesia in an ambulatory setting   K. Scherry Ran ,  MD [phone]

## 2023-04-25 NOTE — Progress Notes (Signed)
Called to room to assist during endoscopic procedure.  Patient ID and intended procedure confirmed with present staff. Received instructions for my participation in the procedure from the performing physician.  

## 2023-04-25 NOTE — Op Note (Signed)
[facility] Patient Name: Pamela King Procedure Date: 04/25/2023 10:53 AM MRN: [id number] Endoscopist: Napoleon Form , MD, [id number] Age: 47 Referring MD:  Date of Birth: 1976-05-20 Gender: Female Account #: [id number] Procedure:                Colonoscopy Indications:              Screening for colorectal malignant neoplasm Medicines:                Monitored Anesthesia Care Procedure:                Pre-Anesthesia Assessment:                           - Prior to the procedure, a History and Physical                            was performed, and patient medications and                            allergies were reviewed. The patient's tolerance of                            previous anesthesia was also reviewed. The risks                            and benefits of the procedure and the sedation                            options and risks were discussed with the patient.                            All questions were answered, and informed consent                            was obtained. Prior Anticoagulants: The patient has                            taken no anticoagulant or antiplatelet agents. ASA                            Grade Assessment: I - A normal, healthy patient.                            After reviewing the risks and benefits, the patient                            was deemed in satisfactory condition to undergo the                            procedure.                           After obtaining informed consent, the colonoscope  was passed under direct vision. Throughout the                            procedure, the patient's blood pressure, pulse, and                            oxygen saturations were monitored continuously. The                            Olympus PCF-H190DL (#[id number]) Colonoscope was                            introduced through the anus and advanced to the the                            cecum, identified by  appendiceal orifice and                            ileocecal valve. The colonoscopy was performed                            without difficulty. The patient tolerated the                            procedure well. The quality of the bowel                            preparation was good. The ileocecal valve,                            appendiceal orifice, and rectum were photographed. Scope In: 11:09:45 AM Scope Out: 11:21:11 AM Scope Withdrawal Time: 0 hours 8 minutes 19 seconds  Total Procedure Duration: 0 hours 11 minutes 26 seconds  Findings:                 The perianal and digital rectal examinations were                            normal.                           A 5 mm polyp was found in the ascending colon. The                            polyp was sessile. The polyp was removed with a                            cold snare. Resection and retrieval were complete.                           A few small-mouthed diverticula were found in the                            sigmoid colon.  Non-bleeding external and internal hemorrhoids were                            found during retroflexion. The hemorrhoids were                            small. Complications:            No immediate complications. Estimated Blood Loss:     Estimated blood loss was minimal. Impression:               - One 5 mm polyp in the ascending colon, removed                            with a cold snare. Resected and retrieved.                           - Diverticulosis in the sigmoid colon.                           - Non-bleeding external and internal hemorrhoids. Recommendation:           - Patient has a contact number available for                            emergencies. The signs and symptoms of potential                            delayed complications were discussed with the                            patient. Return to normal activities tomorrow.                            Written  discharge instructions were provided to the                            patient.                           - Resume previous diet.                           - Continue present medications.                           - Await pathology results.                           - Repeat colonoscopy in 5-10 years for surveillance                            based on pathology results. Napoleon Form, MD 04/25/2023 11:26:08 AM This report has been signed electronically.

## 2023-04-26 ENCOUNTER — Telehealth: Payer: Self-pay

## 2023-04-26 NOTE — Telephone Encounter (Signed)
Attempted f/u call. No answer left VM.  

## 2023-04-27 ENCOUNTER — Encounter: Payer: Self-pay | Admitting: Gastroenterology

## 2023-08-22 ENCOUNTER — Ambulatory Visit (INDEPENDENT_AMBULATORY_CARE_PROVIDER_SITE_OTHER): Payer: Commercial Managed Care - PPO | Admitting: Podiatry

## 2023-08-22 ENCOUNTER — Ambulatory Visit (INDEPENDENT_AMBULATORY_CARE_PROVIDER_SITE_OTHER): Payer: Commercial Managed Care - PPO

## 2023-08-22 DIAGNOSIS — M778 Other enthesopathies, not elsewhere classified: Secondary | ICD-10-CM

## 2023-08-22 DIAGNOSIS — L603 Nail dystrophy: Secondary | ICD-10-CM | POA: Diagnosis not present

## 2023-08-22 DIAGNOSIS — L600 Ingrowing nail: Secondary | ICD-10-CM

## 2023-08-22 NOTE — Patient Instructions (Signed)

## 2023-08-22 NOTE — Progress Notes (Signed)
Subjective:  Patient ID: Pamela King, female    DOB: [birth year],  MRN: [id number]  Chief Complaint  Patient presents with   Foot Pain    Right foot. Pain in tips of toes. Ongoing for years, bunionectomy 2 years ago, hardware removed 09/2021. Seen by podiatrist earlier this year at instride in [place]. 3rd toenail is dark in color but tested negative for fungus. Primary issue is the 1st toe. The tip is twisted and angled towards the second toe.     47 y.o. female presents with concern for pain in the right hallux.  Recently had some significant pain after dancing for a long time.  Thinks it may be a blister present underneath the nail.  Also some dystrophy of the right third toe.  Previously had the nail tested by another podiatrist was negative for fungal infection concern for trauma induced dystrophy.  Past Medical History:  Diagnosis Date   Allergy    seasonal   Arthritis    Knees   Dysmenorrhea    Endometriosis    Infertility, female    Urinary incontinence     Allergies  Allergen Reactions   Macrobid [Nitrofurantoin] Other (See Comments)    Stomach ache, jittery   Wound Dressing Adhesive Other (See Comments) and Swelling    ROS: Negative except as per HPI above  Objective:  General: AAO x3, NAD  Dermatological: Right hallux with erythema and serous fluid present underlying the lateral aspect of the right hallux nail.  Pain on palpation of this area.  Dystrophy and ingrowth noted.  Right third toe with dystrophy as well.   Vascular:  Dorsalis Pedis artery and Posterior Tibial artery pedal pulses are 2/4 bilateral.  Capillary fill time < 3 sec to all digits.   Neruologic: Grossly intact via light touch bilateral. Protective threshold intact to all sites bilateral.   Musculoskeletal: No gross boney pedal deformities bilateral. No pain, crepitus, or limitation noted with foot and ankle range of motion bilateral. Muscular strength 5/5 in all groups tested  bilateral.  Gait: Unassisted, Nonantalgic.   No images are attached to the encounter.  Radiographs:  Date: 08/22/2023 XR the right foot Weightbearing AP/Lateral/Oblique   Findings: Evidence of prior bunionectomy [place] bunionectomy of the first metatarsal.  No hardware present.  Previously removed. Assessment:   1. Ingrown nail of great toe of right foot   2. Capsulitis of foot      Plan:  Patient was evaluated and treated and all questions answered.  # Ingrown nail right great toe, secondary to trauma with blister underlying the nail Ingrown Nail, right -Patient elects to proceed with minor surgery to remove ingrown toenail today. Consent reviewed and signed by patient. -Ingrown nail excised. See procedure note. -Educated on post-procedure care including soaking. Written instructions provided and reviewed. -Patient to follow up in 2 weeks for nail check.  Procedure: Excision of Ingrown Toenail Location: Right 1st toe lateral nail borders. Anesthesia: Lidocaine 1% plain; 1.5 mL and Marcaine 0.5% plain; 1.5 mL, digital block. Skin Prep: Betadine. Dressing: Silvadene; telfa; dry, sterile, compression dressing. Technique: Following skin prep, the toe was exsanguinated and a tourniquet was secured at the base of the toe. The affected nail border was freed, split with a nail splitter, and excised. Chemical matrixectomy was then performed with NaOH and irrigated out with alcohol (if phenol) or vinegar (if NaOH). The tourniquet was then removed and sterile dressing applied. Disposition: Patient tolerated procedure well. Patient to return in 2 weeks for follow-up.  Return if symptoms worsen or fail to improve.          Corinna Gab, DPM [facility]

## 2024-01-02 NOTE — Progress Notes (Signed)
 48 y.o. G46P2002 Married Caucasian female here for annual exam.    Did pelvic floor therapy for mixed incontinence.  Some help from the treatment.  Has some incontinence related to the urge.  No leakage with dancing.  Some leak with sneezing.   Wants to continue her birth control and has menses every 3 months.   Would like routine blood work done.   Married.  2 children. Enjoys line dancing.   PCP: Patient, No Pcp Per   No LMP recorded. (Menstrual status: Oral contraceptives).           Sexually active: Yes.    The current method of family planning is OCP (estrogen/progesterone).    Menopausal hormone therapy:  n/a Exercising: Yes.     Line dancing, swimming Smoker:  no  OB History  Gravida Para Term Preterm AB Living  2 2 2   2   SAB IAB Ectopic Multiple Live Births      2    # Outcome Date GA Lbr Len/2nd Weight Sex Type Anes PTL Lv  2 Term      Vag-Spont   LIV  1 Term      Vag-Spont   LIV     HEALTH MAINTENANCE: Last 2 paps:  12/22/22 neg: HR HPV neg, 10/09/18 neg: HR HPV neg History of abnormal Pap or positive HPV:   Years ago per patient, repeat was normal.   Mammogram:   03/03/23 Breast Density Cat B, BI-RADS CAT 2 benign Colonoscopy:  04/25/23 - polyps - due in 5 years.  Bone Density:  n/a  Result  n/a   Immunization History  Administered Date(s) Administered   Novavax(Covid-19) Vaccine 07/08/2021, 08/06/2021   Tdap 10/09/2018      reports that she has never smoked. She has never used smokeless tobacco. She reports current alcohol use of about 1.0 - 2.0 standard drink of alcohol per week. She reports that she does not use drugs.  Past Medical History:  Diagnosis Date   Allergy    seasonal   Arthritis    Knees   Dysmenorrhea    Endometriosis    Infertility, female    Urinary incontinence     Past Surgical History:  Procedure Laterality Date   BUNIONECTOMY Right 05/2021   GANGLION CYST EXCISION Bilateral 2005   left 2018   HYSTEROSCOPY  2006    OTHER SURGICAL HISTORY     Repair of bicornuate uterus   REFRACTIVE SURGERY  2001    Current Outpatient Medications  Medication Sig Dispense Refill   Black Elderberry 1000 MG CAPS Take 2 capsules by mouth once a week.     CANNABIDIOL PO Take by mouth.     Cholecalciferol (VITAMIN D3) 1.25 MG (50000 UT) CAPS Take by mouth.     Fexofenadine HCl (ALLEGRA PO) Take 1 tablet by mouth daily.     Ginger, Zingiber officinalis, (GINGER ROOT) 550 MG CAPS Take by mouth.     Levonorgestrel-Ethinyl Estradiol (AMETHIA) 0.15-0.03 &0.01 MG tablet Take 1 tablet by mouth daily. 91 tablet 3   Multiple Vitamin (MULTIVITAMIN) tablet Take 1 tablet by mouth daily.     naproxen sodium (ALEVE) 220 MG tablet Take 220 mg by mouth.     Omega-3 Fatty Acids (FISH OIL PO) Take 1 capsule by mouth daily.     Turmeric (QC TUMERIC COMPLEX PO) Take by mouth.     Zinc 50 MG TABS Take by mouth.     No current facility-administered medications for this visit.  ALLERGIES: Macrobid [nitrofurantoin] and Wound dressing adhesive  Family History  Problem Relation Age of Onset   Colon polyps Mother    Diabetes Father 36   High Cholesterol Father    Hypertension Father    Diabetes Brother 19   Hyperlipidemia Brother    Obesity Brother    Diabetes Brother 20   Hyperlipidemia Brother    Obesity Brother    Alcohol abuse Maternal Grandfather    Breast cancer Paternal Grandmother    Colon cancer Neg Hx    Esophageal cancer Neg Hx    Stomach cancer Neg Hx    Rectal cancer Neg Hx     Review of Systems  All other systems reviewed and are negative.   PHYSICAL EXAM:  BP 112/80 (BP Location: Left Arm, Patient Position: Sitting, Cuff Size: Small)   Ht 5' 5.5" (1.664 m)   Wt 143 lb (64.9 kg)   BMI 23.43 kg/m     General appearance: alert, cooperative and appears stated age Head: normocephalic, without obvious abnormality, atraumatic Neck: no adenopathy, supple, symmetrical, trachea midline and thyroid normal to  inspection and palpation Lungs: clear to auscultation bilaterally Breasts: normal appearance, no masses or tenderness, No nipple retraction or dimpling, No nipple discharge or bleeding, No axillary adenopathy Heart: regular rate and rhythm Abdomen: soft, non-tender; no masses, no organomegaly Extremities: extremities normal, atraumatic, no cyanosis or edema Skin: skin color, texture, turgor normal. No rashes or lesions Lymph nodes: cervical, supraclavicular, and axillary nodes normal. Neurologic: grossly normal  Pelvic: External genitalia:  no lesions              No abnormal inguinal nodes palpated.              Urethra:  normal appearing urethra with no masses, tenderness or lesions              Bartholins and Skenes: normal                 Vagina: normal appearing vagina with normal color and discharge, no lesions              Cervix: no lesions              Pap taken: no Bimanual Exam:  Uterus:  normal size, contour, position, consistency, mobility, non-tender              Adnexa: no mass, fullness, tenderness              Rectal exam: yes.  Confirms.              Anus:  normal sphincter tone, no lesions  Chaperone was present for exam:  Emmaline Haring, CMA  ASSESSMENT: Well woman visit with gynecologic exam. Hx bicornuate uterus.  Status post surgical repair.  Status post laparoscopic surgery for endometriosis.  On COCs. Mixed incontinence.  PHQ-9: 0  PLAN: Mammogram screening discussed. Self breast awareness reviewed. Pap and HRV collected:  no.  Due in 2029 Guidelines for Calcium, Vitamin D, regular exercise program including cardiovascular and weight bearing exercise. Medication refills:  COCs for one year.  Routine labs:  cholesterol, CMP, CBC, vit D, TSH. Follow up:  yearly and prn.

## 2024-01-16 ENCOUNTER — Encounter: Payer: Self-pay | Admitting: Obstetrics and Gynecology

## 2024-01-16 ENCOUNTER — Ambulatory Visit (INDEPENDENT_AMBULATORY_CARE_PROVIDER_SITE_OTHER): Payer: Commercial Managed Care - PPO | Admitting: Obstetrics and Gynecology

## 2024-01-16 VITALS — BP 112/80 | Ht 65.5 in | Wt 143.0 lb

## 2024-01-16 DIAGNOSIS — Z0000 Encounter for general adult medical examination without abnormal findings: Secondary | ICD-10-CM

## 2024-01-16 DIAGNOSIS — Z01419 Encounter for gynecological examination (general) (routine) without abnormal findings: Secondary | ICD-10-CM

## 2024-01-16 DIAGNOSIS — Z3041 Encounter for surveillance of contraceptive pills: Secondary | ICD-10-CM | POA: Diagnosis not present

## 2024-01-16 DIAGNOSIS — Z1331 Encounter for screening for depression: Secondary | ICD-10-CM | POA: Diagnosis not present

## 2024-01-16 MED ORDER — LEVONORGEST-ETH ESTRAD 91-DAY 0.15-0.03 &0.01 MG PO TABS
1.0000 | ORAL_TABLET | Freq: Every day | ORAL | 3 refills | Status: AC
Start: 2024-01-16 — End: ?

## 2024-01-16 NOTE — Patient Instructions (Signed)

## 2024-01-17 LAB — VITAMIN D 25 HYDROXY (VIT D DEFICIENCY, FRACTURES): Vit D, 25-Hydroxy: 65 ng/mL (ref 30–100)

## 2024-01-17 LAB — COMPREHENSIVE METABOLIC PANEL WITH GFR
AG Ratio: 1.7 (calc) (ref 1.0–2.5)
ALT: 7 U/L (ref 6–29)
AST: 15 U/L (ref 10–35)
Albumin: 4.3 g/dL (ref 3.6–5.1)
Alkaline phosphatase (APISO): 45 U/L (ref 31–125)
BUN: 13 mg/dL (ref 7–25)
CO2: 27 mmol/L (ref 20–32)
Calcium: 9.4 mg/dL (ref 8.6–10.2)
Chloride: 103 mmol/L (ref 98–110)
Creat: 0.85 mg/dL (ref 0.50–0.99)
Globulin: 2.5 g/dL (ref 1.9–3.7)
Glucose, Bld: 81 mg/dL (ref 65–99)
Potassium: 4.1 mmol/L (ref 3.5–5.3)
Sodium: 138 mmol/L (ref 135–146)
Total Bilirubin: 0.9 mg/dL (ref 0.2–1.2)
Total Protein: 6.8 g/dL (ref 6.1–8.1)
eGFR: 86 mL/min/{1.73_m2} (ref >=60)

## 2024-01-17 LAB — CBC
HCT: 39.4 % (ref 35.0–45.0)
Hemoglobin: 13.3 g/dL (ref 11.7–15.5)
MCH: 32.3 pg (ref 27.0–33.0)
MCHC: 33.8 g/dL (ref 32.0–36.0)
MCV: 95.6 fL (ref 80.0–100.0)
MPV: 11.8 fL (ref 7.5–12.5)
Platelets: 264 10*3/uL (ref 140–400)
RBC: 4.12 10*6/uL (ref 3.80–5.10)
RDW: 11.5 % (ref 11.0–15.0)
WBC: 6.9 10*3/uL (ref 3.8–10.8)

## 2024-01-17 LAB — LIPID PANEL
Cholesterol: 176 mg/dL (ref <200)
HDL: 59 mg/dL (ref >=50)
LDL Cholesterol (Calc): 90 mg/dL
Non-HDL Cholesterol (Calc): 117 mg/dL (ref <130)
Total CHOL/HDL Ratio: 3 (calc) (ref <5.0)
Triglycerides: 173 mg/dL — ABNORMAL HIGH (ref <150)

## 2024-01-17 LAB — TSH: TSH: 1.35 m[IU]/L

## 2024-01-18 ENCOUNTER — Encounter: Payer: Self-pay | Admitting: Obstetrics and Gynecology

## 2024-02-05 ENCOUNTER — Other Ambulatory Visit: Payer: Self-pay | Admitting: Obstetrics and Gynecology

## 2024-02-05 DIAGNOSIS — Z1231 Encounter for screening mammogram for malignant neoplasm of breast: Secondary | ICD-10-CM

## 2024-02-27 ENCOUNTER — Other Ambulatory Visit: Payer: Self-pay | Admitting: Medical Genetics

## 2024-02-29 ENCOUNTER — Other Ambulatory Visit: Payer: Self-pay

## 2024-02-29 DIAGNOSIS — Z006 Encounter for examination for normal comparison and control in clinical research program: Secondary | ICD-10-CM

## 2024-03-04 ENCOUNTER — Ambulatory Visit
Admission: RE | Admit: 2024-03-04 | Discharge: 2024-03-04 | Disposition: A | Discharge status: Home / Self Care | Source: Ambulatory Visit

## 2024-03-04 DIAGNOSIS — Z1231 Encounter for screening mammogram for malignant neoplasm of breast: Secondary | ICD-10-CM

## 2024-03-07 ENCOUNTER — Ambulatory Visit: Payer: Self-pay | Admitting: Obstetrics and Gynecology

## 2024-03-08 LAB — GENECONNECT MOLECULAR SCREEN: Genetic Analysis Overall Interpretation: NEGATIVE

## 2024-04-16 ENCOUNTER — Ambulatory Visit (INDEPENDENT_AMBULATORY_CARE_PROVIDER_SITE_OTHER): Admitting: Family Medicine

## 2024-04-16 VITALS — BP 110/72 | HR 81 | Ht 65.5 in | Wt 143.0 lb

## 2024-04-16 DIAGNOSIS — M7918 Myalgia, other site: Secondary | ICD-10-CM

## 2024-04-16 DIAGNOSIS — M357 Hypermobility syndrome: Secondary | ICD-10-CM | POA: Diagnosis not present

## 2024-04-16 NOTE — Patient Instructions (Addendum)
 Thank you for coming in today.   Check out the book Disjointed Navigating the Diagnosis and Management of Hypermobile Ehlers-Danlos Syndrome and Hypermobility Spectrum Disorders.   I've referred you to Physical Therapy.  Let us  know if you don't hear from them in one week.   Plantar fascia night splint  Knee brace at night time  I recommend Lucie Buttner or Alyssa Allwardt for primary care

## 2024-04-16 NOTE — Progress Notes (Unsigned)
   LILLETTE Ileana Collet, PhD, LAT, ATC acting as a scribe for Artist Lloyd, MD.  Pamela King is a 48 y.o. female who presents to [facility], [place] today for evaluation of her hypermobility. She notes finding CBD helpful, esp at night.  Patient has had evaluation with [facility] including MRI that did show some degeneration.  She notes occasional left knee locking with full flexion at sleep and some toe cramping bilaterally at sleep.  MS: finger hypermobility, bilat knees and toes Skin/Immune reactions: easily bruises easily Nervous system: dizziness Head/Spine: hx scoliosis CV: ED visit for tachycardia GI: heartburn Genitourinary: urinary incontinence and frequency Hands & Feet: piezogenic papules  Pertinent review of systems: No fevers or chills  Relevant historical information: History of a bifid uterus with surgical repair.   Exam:  BP 110/72   Pulse 81   Ht 5' 5.5 (1.664 m)   Wt 143 lb (64.9 kg)   SpO2 99%   BMI 23.43 kg/m  General: Well Developed, well nourished, and in no acute distress.   MSK: Hypermobility evaluation Beighton hypermobility score 7/9. 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  Patient definitely is hypermobile but does not meet criteria for Ehlers-Danlos.  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  For her orthopedic issues she has done quite a lot already including physical therapy.  Her biggest problem is left knee locking when she flexes her knee with sleep and toe cramping.  We talked about options and we will consider plantar fasciitis night splint which should immobilize the toe and a bulky knee immobilizer at bedtime to help prevent full knee flexion which causes some locking symptoms.  Recommended some reading including  disjointed book.   PDMP not reviewed this encounter. Orders Placed This Encounter  Procedures   Ambulatory referral to Physical Therapy    Referral Priority:   Routine    Referral Type:   Physical Medicine    Referral Reason:   Specialty Services Required    Requested Specialty:   Physical Therapy    Number of Visits Requested:   1   No orders of the defined types were placed in this encounter.    Discussed warning signs or symptoms. Please see discharge instructions. Patient expresses understanding.   The above documentation has been reviewed and is accurate and complete Artist Lloyd, M.D.

## 2024-04-17 DIAGNOSIS — M357 Hypermobility syndrome: Secondary | ICD-10-CM | POA: Insufficient documentation

## 2024-05-12 ENCOUNTER — Encounter

## 2024-11-27 ENCOUNTER — Ambulatory Visit: Admitting: Physician Assistant

## 2025-01-16 ENCOUNTER — Ambulatory Visit: Admitting: Obstetrics and Gynecology

## 2025-01-20 ENCOUNTER — Ambulatory Visit: Admitting: Obstetrics and Gynecology
